# Patient Record
Sex: Female | Born: 1950 | Race: Black or African American | Hispanic: No | Marital: Married
Health system: Southern US, Community
[De-identification: ages and names within clinical notes are randomized; demographics above are authoritative.]

## PROBLEM LIST (undated history)

## (undated) DIAGNOSIS — E7800 Pure hypercholesterolemia, unspecified: Secondary | ICD-10-CM

## (undated) DIAGNOSIS — N289 Disorder of kidney and ureter, unspecified: Secondary | ICD-10-CM

## (undated) DIAGNOSIS — R7301 Impaired fasting glucose: Secondary | ICD-10-CM

## (undated) DIAGNOSIS — I10 Essential (primary) hypertension: Secondary | ICD-10-CM

## (undated) HISTORY — DX: Pure hypercholesterolemia, unspecified: E78.00

## (undated) HISTORY — DX: Impaired fasting glucose: R73.01

## (undated) HISTORY — DX: Disorder of kidney and ureter, unspecified: N28.9

## (undated) HISTORY — DX: Essential (primary) hypertension: I10

---

## 2017-10-23 DIAGNOSIS — Z9289 Personal history of other medical treatment: Secondary | ICD-10-CM

## 2017-10-23 HISTORY — DX: Personal history of other medical treatment: Z92.89

## 2020-12-12 ENCOUNTER — Emergency Department (HOSPITAL_COMMUNITY): Payer: Medicare PPO

## 2020-12-12 ENCOUNTER — Other Ambulatory Visit: Payer: Self-pay

## 2020-12-12 ENCOUNTER — Observation Stay (HOSPITAL_COMMUNITY)
Admission: EM | Admit: 2020-12-12 | Discharge: 2020-12-13 | Disposition: A | Discharge status: Home / Self Care | Payer: Medicare PPO | Attending: Emergency Medicine | Admitting: Emergency Medicine

## 2020-12-12 ENCOUNTER — Observation Stay (HOSPITAL_COMMUNITY): Payer: Medicare PPO

## 2020-12-12 DIAGNOSIS — M6281 Muscle weakness (generalized): Secondary | ICD-10-CM | POA: Diagnosis not present

## 2020-12-12 DIAGNOSIS — I10 Essential (primary) hypertension: Secondary | ICD-10-CM | POA: Diagnosis not present

## 2020-12-12 DIAGNOSIS — Z87891 Personal history of nicotine dependence: Secondary | ICD-10-CM | POA: Diagnosis not present

## 2020-12-12 DIAGNOSIS — R29898 Other symptoms and signs involving the musculoskeletal system: Secondary | ICD-10-CM | POA: Diagnosis not present

## 2020-12-12 DIAGNOSIS — R7303 Prediabetes: Secondary | ICD-10-CM | POA: Diagnosis not present

## 2020-12-12 DIAGNOSIS — Z20822 Contact with and (suspected) exposure to covid-19: Secondary | ICD-10-CM | POA: Diagnosis not present

## 2020-12-12 DIAGNOSIS — I639 Cerebral infarction, unspecified: Secondary | ICD-10-CM | POA: Diagnosis not present

## 2020-12-12 DIAGNOSIS — R4701 Aphasia: Secondary | ICD-10-CM | POA: Diagnosis present

## 2020-12-12 DIAGNOSIS — I6300 Cerebral infarction due to thrombosis of unspecified precerebral artery: Secondary | ICD-10-CM

## 2020-12-12 LAB — DIFFERENTIAL
Basophils Absolute: 0 10*3/uL (ref 0.0–0.1)
Basophils Relative: 0 %
Eosinophils Absolute: 0 10*3/uL (ref 0.0–0.5)
Eosinophils Relative: 0 %
Lymphocytes Relative: 14 %
Lymphs Abs: 1.1 10*3/uL (ref 0.7–4.0)
Monocytes Absolute: 0.5 10*3/uL (ref 0.1–1.0)
Monocytes Relative: 7 %
Neutro Abs: 5.8 10*3/uL (ref 1.7–7.7)
Neutrophils Relative %: 78 %

## 2020-12-12 LAB — COMPREHENSIVE METABOLIC PANEL
ALT: 19 U/L (ref 0–44)
AST: 21 U/L (ref 15–41)
Albumin: 3.6 g/dL (ref 3.5–5.0)
Alkaline Phosphatase: 76 U/L (ref 38–126)
Anion gap: 7 (ref 5–15)
BUN: 13 mg/dL (ref 8–23)
CO2: 24 mmol/L (ref 22–32)
Calcium: 9.5 mg/dL (ref 8.9–10.3)
Chloride: 106 mmol/L (ref 98–111)
Creatinine, Ser: 1.13 mg/dL — ABNORMAL HIGH (ref 0.44–1.00)
GFR, Estimated: 52 mL/min — ABNORMAL LOW (ref >60)
Glucose, Bld: 161 mg/dL — ABNORMAL HIGH (ref 70–99)
Potassium: 3.9 mmol/L (ref 3.5–5.1)
Sodium: 137 mmol/L (ref 135–145)
Total Bilirubin: 0.6 mg/dL (ref 0.3–1.2)
Total Protein: 7.6 g/dL (ref 6.5–8.1)

## 2020-12-12 LAB — CBC
HCT: 46.5 % — ABNORMAL HIGH (ref 36.0–46.0)
Hemoglobin: 15.2 g/dL — ABNORMAL HIGH (ref 12.0–15.0)
MCH: 31.3 pg (ref 26.0–34.0)
MCHC: 32.7 g/dL (ref 30.0–36.0)
MCV: 95.7 fL (ref 80.0–100.0)
Platelets: 361 10*3/uL (ref 150–400)
RBC: 4.86 MIL/uL (ref 3.87–5.11)
RDW: 11.5 % (ref 11.5–15.5)
WBC: 7.5 10*3/uL (ref 4.0–10.5)
nRBC: 0 % (ref 0.0–0.2)

## 2020-12-12 LAB — I-STAT CHEM 8, ED
BUN: 15 mg/dL (ref 8–23)
Calcium, Ion: 1.06 mmol/L — ABNORMAL LOW (ref 1.15–1.40)
Chloride: 105 mmol/L (ref 98–111)
Creatinine, Ser: 1.2 mg/dL — ABNORMAL HIGH (ref 0.44–1.00)
Glucose, Bld: 158 mg/dL — ABNORMAL HIGH (ref 70–99)
HCT: 46 % (ref 36.0–46.0)
Hemoglobin: 15.6 g/dL — ABNORMAL HIGH (ref 12.0–15.0)
Potassium: 3.9 mmol/L (ref 3.5–5.1)
Sodium: 140 mmol/L (ref 135–145)
TCO2: 24 mmol/L (ref 22–32)

## 2020-12-12 LAB — CBG MONITORING, ED: Glucose-Capillary: 145 mg/dL — ABNORMAL HIGH (ref 70–99)

## 2020-12-12 LAB — PROTIME-INR
INR: 1.1 (ref 0.8–1.2)
Prothrombin Time: 13.9 seconds (ref 11.4–15.2)

## 2020-12-12 LAB — APTT: aPTT: 27 seconds (ref 24–36)

## 2020-12-12 LAB — RESP PANEL BY RT-PCR (FLU A&B, COVID) ARPGX2
Influenza A by PCR: NEGATIVE
Influenza B by PCR: NEGATIVE
SARS Coronavirus 2 by RT PCR: NEGATIVE

## 2020-12-12 IMAGING — MR MR MRA NECK W/O CM
2 series · 18 of 48 positions shown · non-contrast
Comparison: None.

CLINICAL DATA: Neuro deficit, acute, stroke suspected; Stroke,
follow up; aphasia and facial droop



[Series 5: TOF · axial · 2.4mm · 0.47mm/px · z∈[-261,-91]mm · 15 of 152 slices shown (1 of 2)]
[im 1/152]
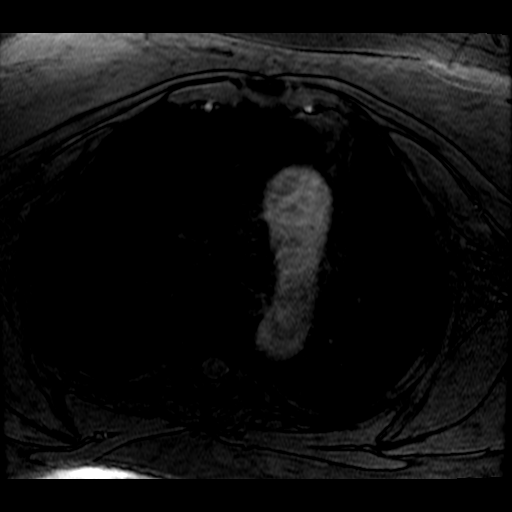
[im 4/152]
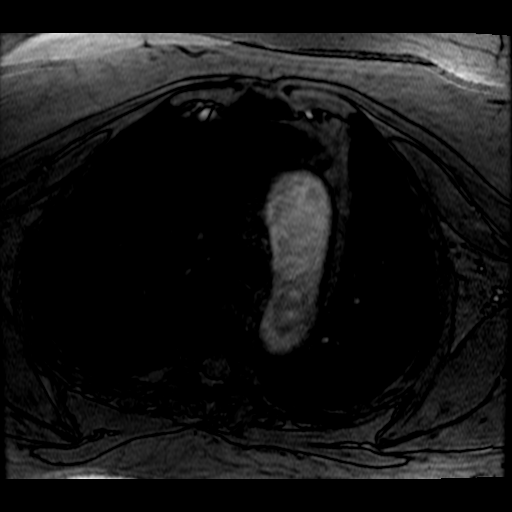
[im 7/152]
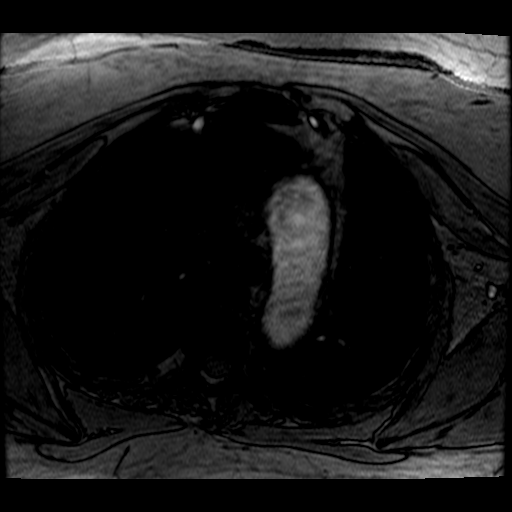
[im 11/152]
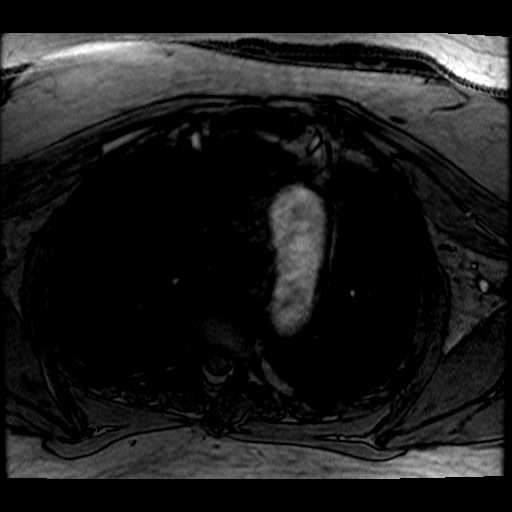
[im 14/152]
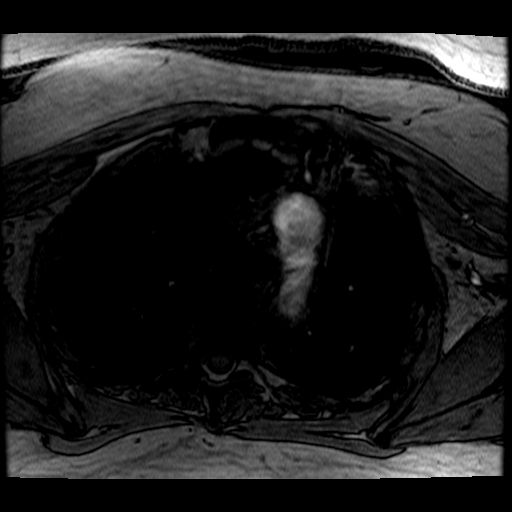
[im 25/152]
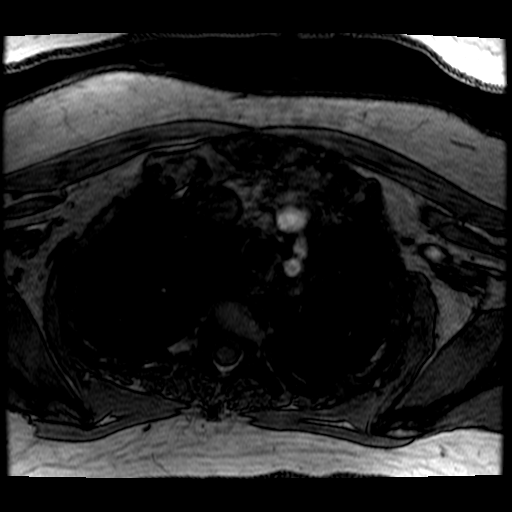
[im 28/152]
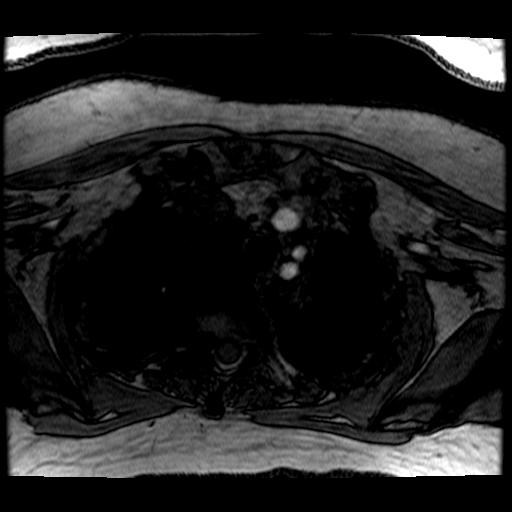
[im 49/152]
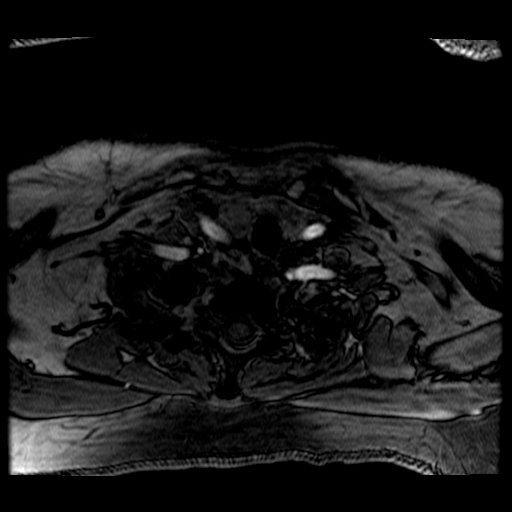
[im 66/152]
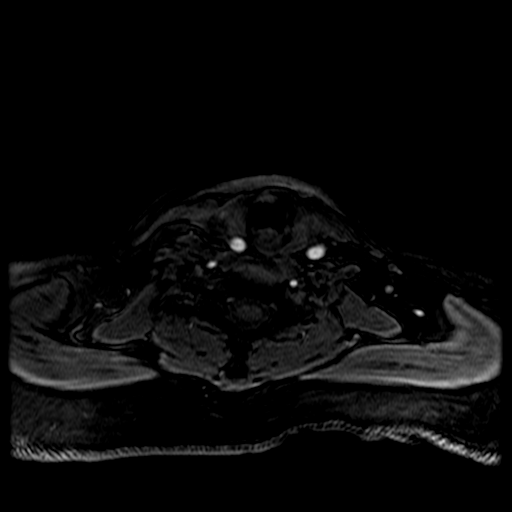
[im 76/152]
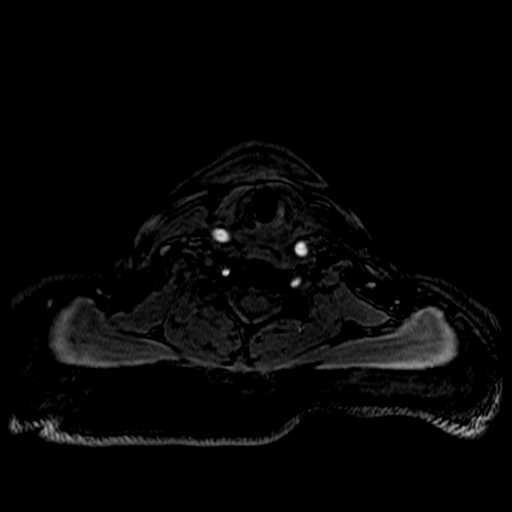
[im 86/152]
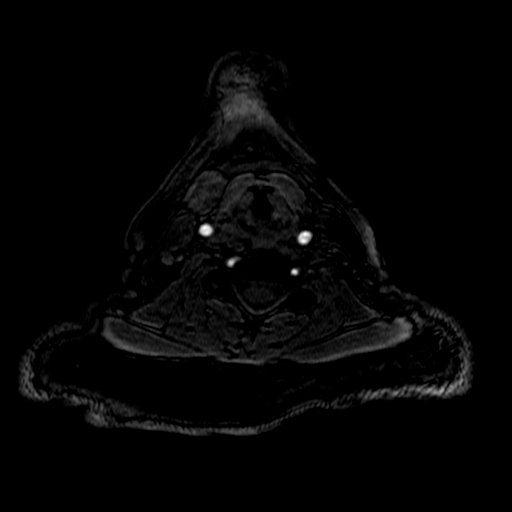
[im 103/152]
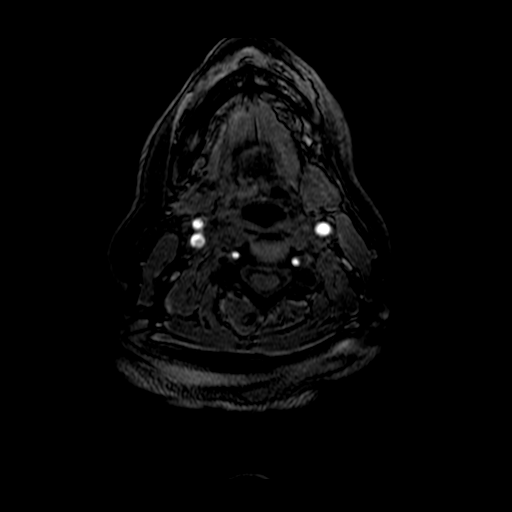
[im 124/152]
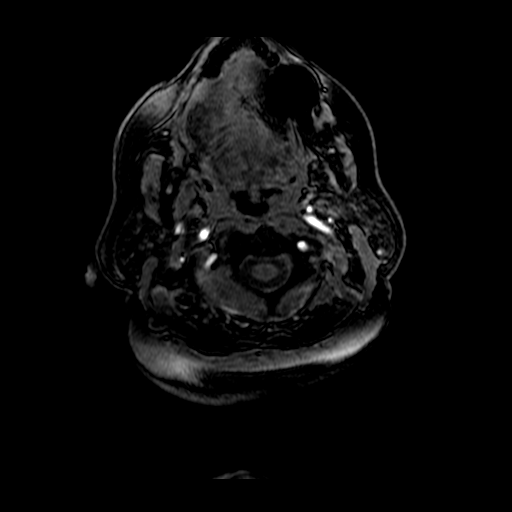
[im 127/152]
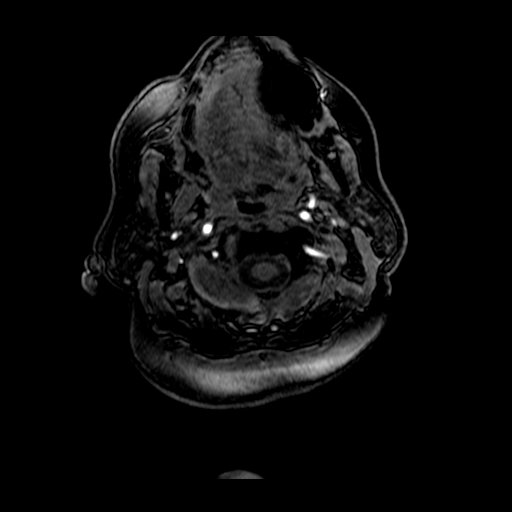
[im 145/152]
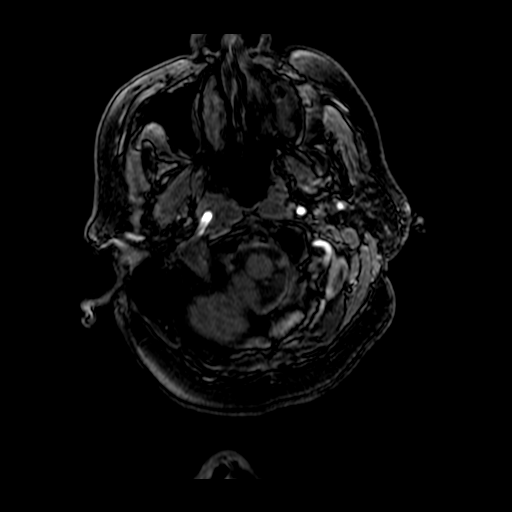

[Series 501: TOF · sagittal · 2.4mm · 0.47mm/px · 3 of 10 slices shown (2 of 2)]
[im 1/10]
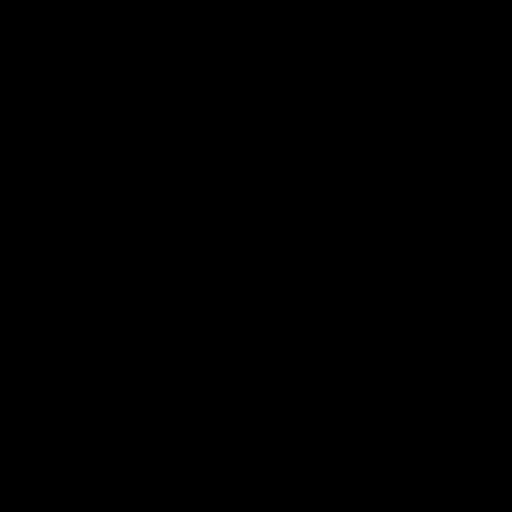
[im 5/10]
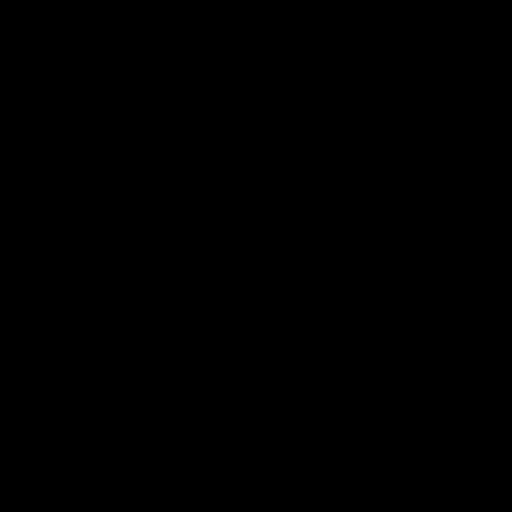
[im 10/10]
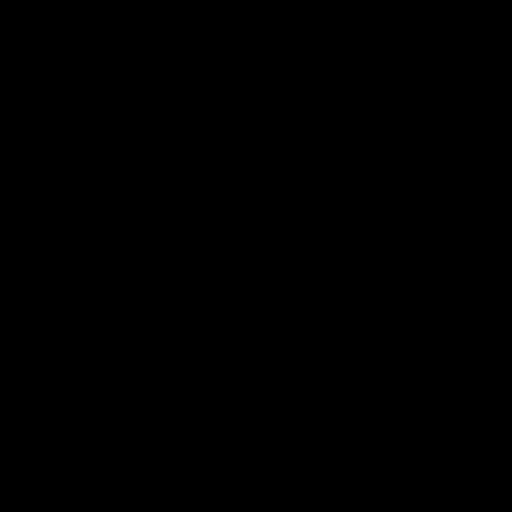

[18 of 48 positions shown; findings below may reference images not displayed]

FINDINGS: MRI HEAD

Brain: Small foci of reduced diffusion present along the lateral
left precentral gyrus. There is no evidence of intracranial
hemorrhage. There is no intracranial mass, mass effect, or edema.
There is no hydrocephalus or extra-axial fluid collection.
Ventricles and sulci are within normal limits in size and
configuration. Patchy foci of T2 hyperintensity in the
supratentorial white matter are nonspecific but may reflect mild
chronic microvascular ischemic changes.

Vascular: Major vessel flow voids at the skull base are preserved.

Skull and upper cervical spine: Normal marrow signal is preserved.

Sinuses/Orbits: Paranasal sinus mucosal thickening. Orbits are
unremarkable.

Other: Sella is unremarkable.  Mastoid air cells are clear.

MRA HEAD

Intracranial internal carotid arteries are patent. There is a 3 x 2
mm (measured axially) medially and inferiorly directed aneurysm of
the proximal supraclinoid right ICA. There is a 5 x 4 mm (measured
sagittally) superiorly directed aneurysm of the paraclinoid left
ICA. Middle and anterior cerebral arteries are patent.

Intracranial vertebral arteries, basilar artery, posterior cerebral
arteries are patent. Incidental small fenestration of the proximal
basilar artery. Bilateral posterior communicating arteries are
present.

MRA NECK

Great vessel origins are patent. Common, internal, and external
carotid arteries are patent. Extracranial vertebral arteries are
patent and codominant. There is no hemodynamically significant
stenosis.
IMPRESSION: Small acute infarcts of the left precentral gyrus.

No occlusion or significant stenosis in the neck. No proximal
intracranial vessel occlusion or significant stenosis.

3 mm proximal supraclinoid right ICA aneurysm. 5 mm left paraclinoid
ICA aneurysm.

## 2020-12-12 IMAGING — MR MR HEAD W/O CM
6 of 10 series · 29 of 48 positions shown · non-contrast
Comparison: None.

CLINICAL DATA: Neuro deficit, acute, stroke suspected; Stroke,
follow up; aphasia and facial droop



[Series 3: DWI · axial · 3.0mm · 0.94mm/px · z∈[-137,+2]mm · 9 of 97 slices shown (1 of 2)]
[im 1/97]
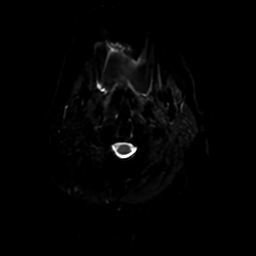
[im 13/97]
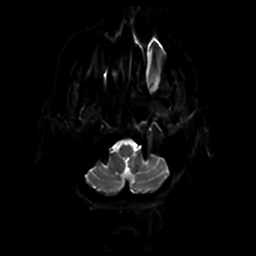
[im 25/97]
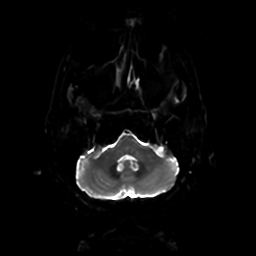
[im 37/97]
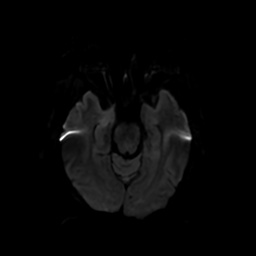
[im 49/97]
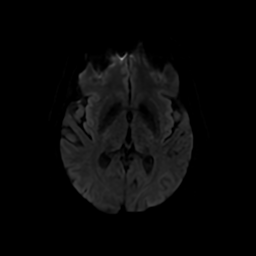
[im 61/97]
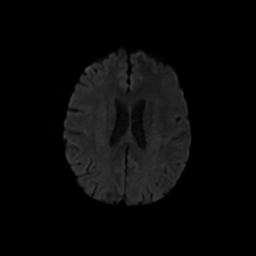
[im 73/97]
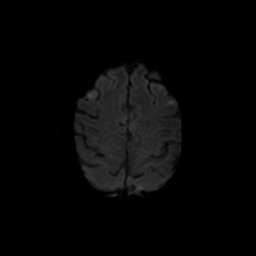
[im 85/97]
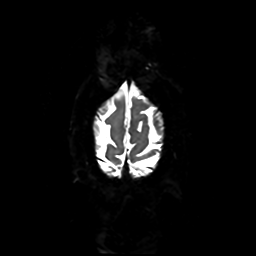
[im 97/97]
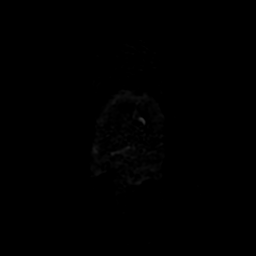

[Series 6: DWI · coronal · 4.0mm · 0.94mm/px · 7 of 70 slices shown (2 of 2)]
[im 1/70]
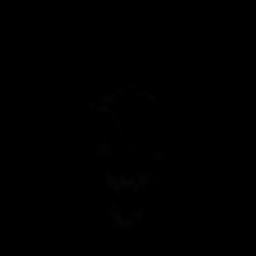
[im 12/70]
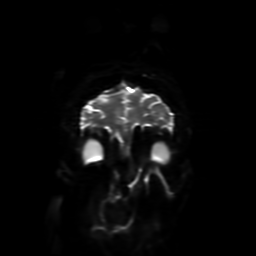
[im 24/70]
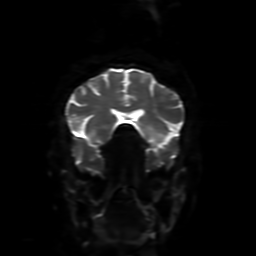
[im 35/70]
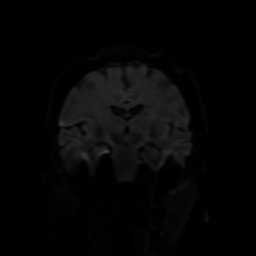
[im 47/70]
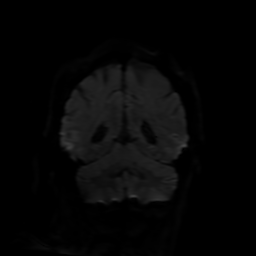
[im 58/70]
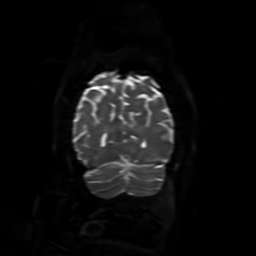
[im 70/70]
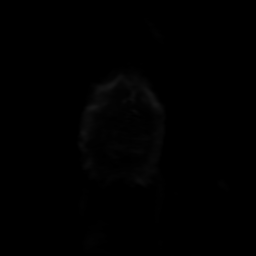

[Series 7: FLAIR · axial · 4.0mm · 0.45mm/px · z∈[-134,+3]mm · 3 of 34 slices shown (1 of 2)]
[im 1/34]
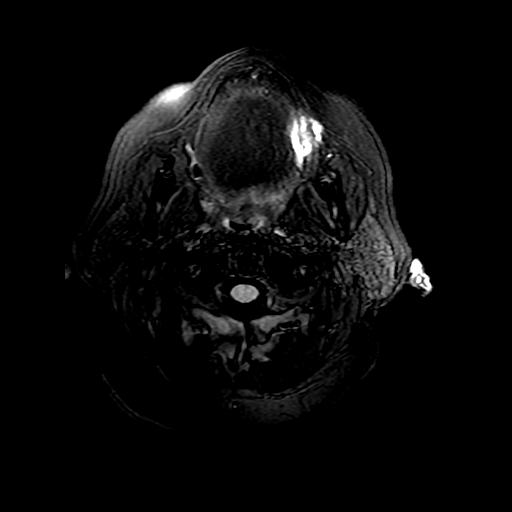
[im 17/34]
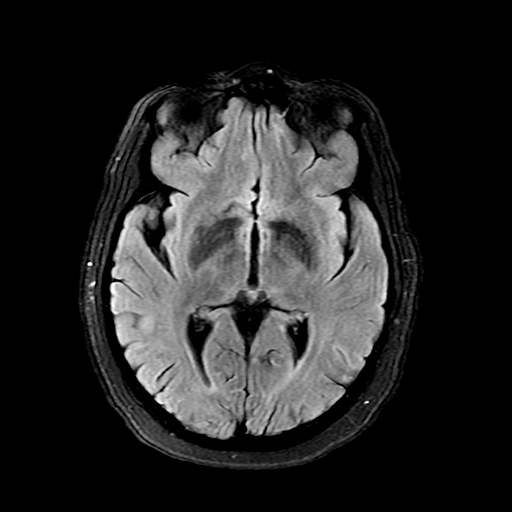
[im 34/34]
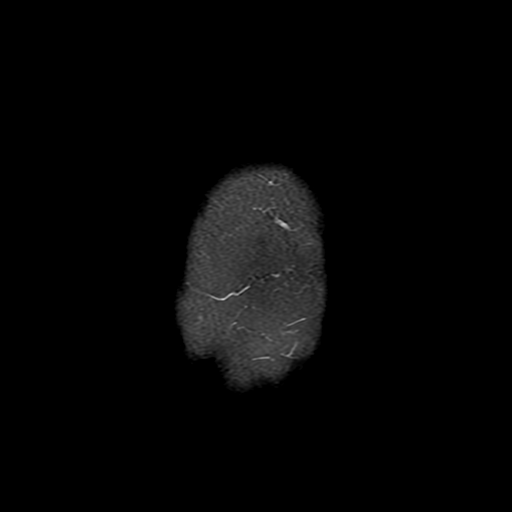

[Series 9: FLAIR · sagittal · 5.0mm · 0.23mm/px · 2 of 23 slices shown (2 of 2)]
[im 1/23]
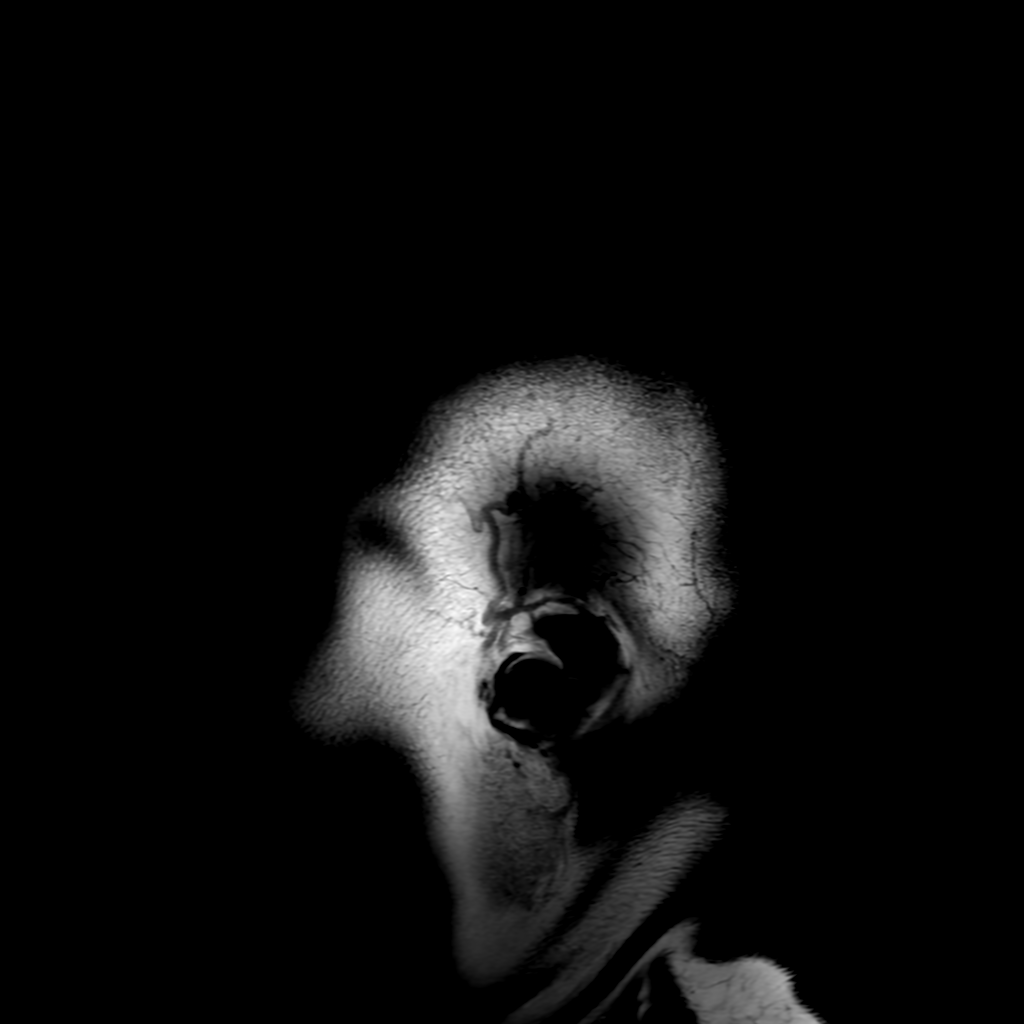
[im 23/23]
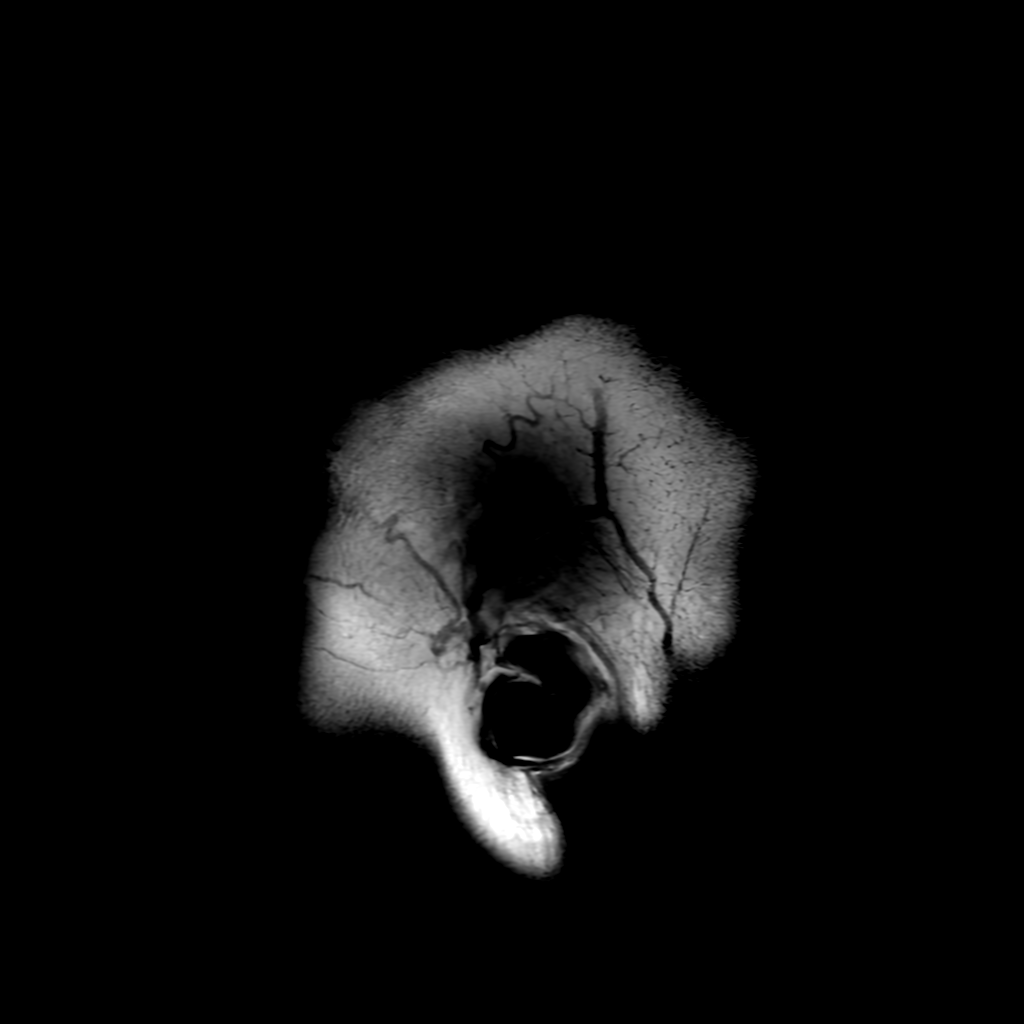

[Series 350: ADC · axial · 3.0mm · 0.94mm/px · z∈[-137,+2]mm · 5 of 50 slices shown (1 of 2)]
[im 1/50]
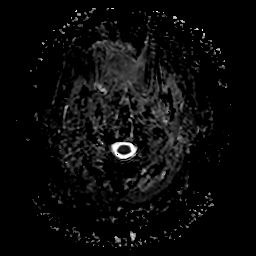
[im 13/50]
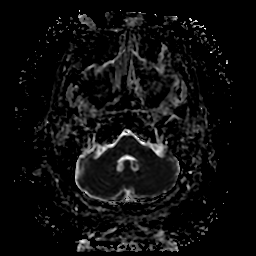
[im 25/50]
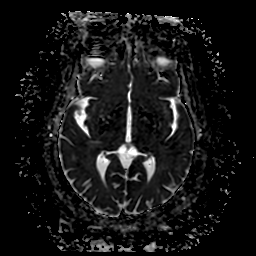
[im 37/50]
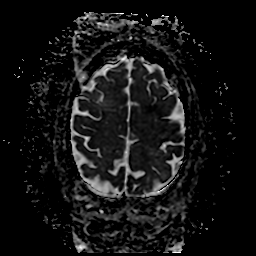
[im 50/50]
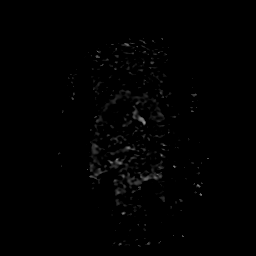

[Series 650: ADC · coronal · 4.0mm · 0.94mm/px · 3 of 35 slices shown (2 of 2)]
[im 1/35]
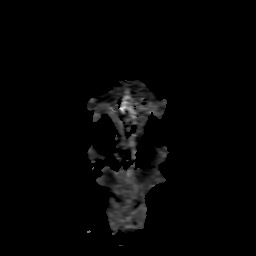
[im 18/35]
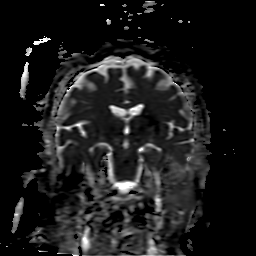
[im 35/35]
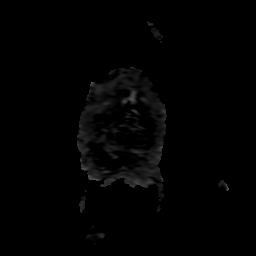

[29 of 48 positions shown; findings below may reference images not displayed]

FINDINGS: MRI HEAD

Brain: Small foci of reduced diffusion present along the lateral
left precentral gyrus. There is no evidence of intracranial
hemorrhage. There is no intracranial mass, mass effect, or edema.
There is no hydrocephalus or extra-axial fluid collection.
Ventricles and sulci are within normal limits in size and
configuration. Patchy foci of T2 hyperintensity in the
supratentorial white matter are nonspecific but may reflect mild
chronic microvascular ischemic changes.

Vascular: Major vessel flow voids at the skull base are preserved.

Skull and upper cervical spine: Normal marrow signal is preserved.

Sinuses/Orbits: Paranasal sinus mucosal thickening. Orbits are
unremarkable.

Other: Sella is unremarkable.  Mastoid air cells are clear.

MRA HEAD

Intracranial internal carotid arteries are patent. There is a 3 x 2
mm (measured axially) medially and inferiorly directed aneurysm of
the proximal supraclinoid right ICA. There is a 5 x 4 mm (measured
sagittally) superiorly directed aneurysm of the paraclinoid left
ICA. Middle and anterior cerebral arteries are patent.

Intracranial vertebral arteries, basilar artery, posterior cerebral
arteries are patent. Incidental small fenestration of the proximal
basilar artery. Bilateral posterior communicating arteries are
present.

MRA NECK

Great vessel origins are patent. Common, internal, and external
carotid arteries are patent. Extracranial vertebral arteries are
patent and codominant. There is no hemodynamically significant
stenosis.
IMPRESSION: Small acute infarcts of the left precentral gyrus.

No occlusion or significant stenosis in the neck. No proximal
intracranial vessel occlusion or significant stenosis.

3 mm proximal supraclinoid right ICA aneurysm. 5 mm left paraclinoid
ICA aneurysm.

## 2020-12-12 IMAGING — CT CT HEAD CODE STROKE
3 series · 16 of 47 positions shown, 19 images · non-contrast
Comparison: None.

CLINICAL DATA: Code stroke. Neuro deficit, acute, stroke suspected.
Right facial droop, slurred speech

EXAM:
CT HEAD WITHOUT CONTRAST
TECHNIQUE: Contiguous axial images were obtained from the base of the skull
through the vertex without intravenous contrast.

[Series 2: head 5.0 st · axial · 0.39mm/px · z∈[-69,+61]mm · 10 of 32 slices shown, 13 images]
[im 3/32  brain]
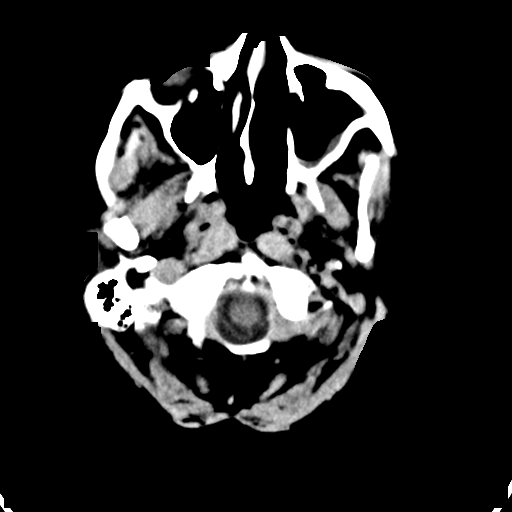
[im 3/32  bone]
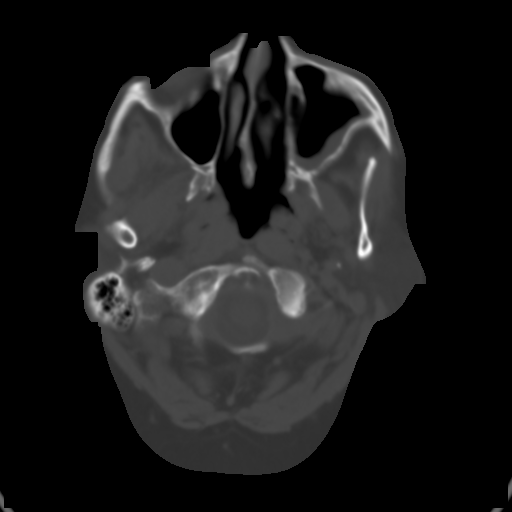
[im 6/32  brain]
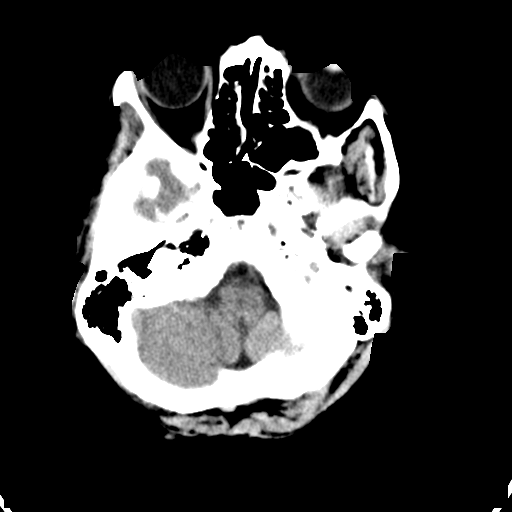
[im 9/32  brain]
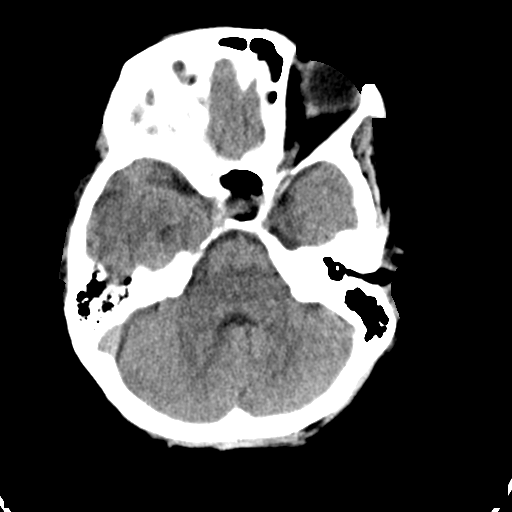
[im 11/32  brain]
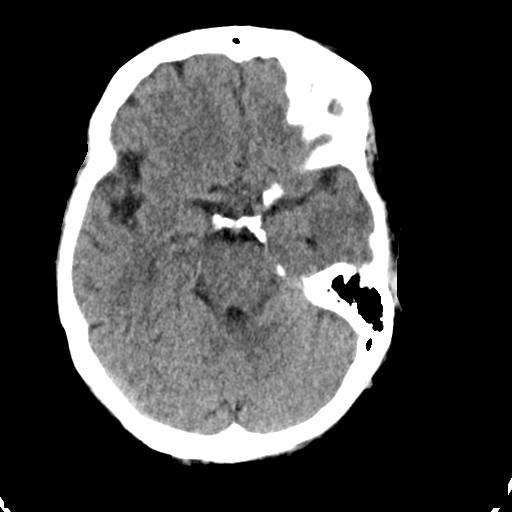
[im 14/32  brain]
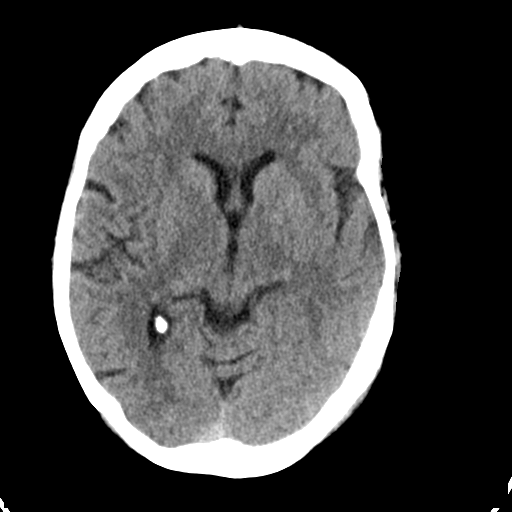
[im 14/32  bone]
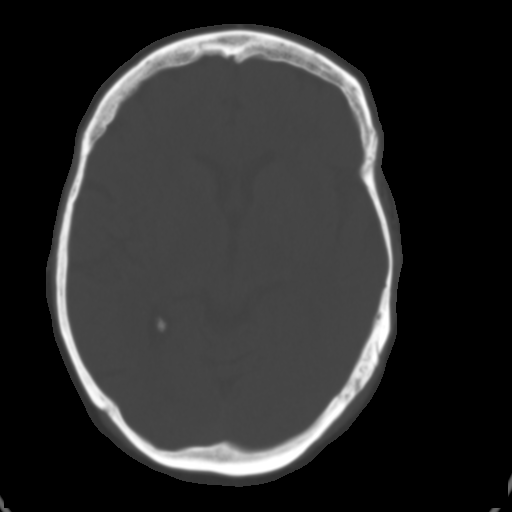
[im 18/32  brain]
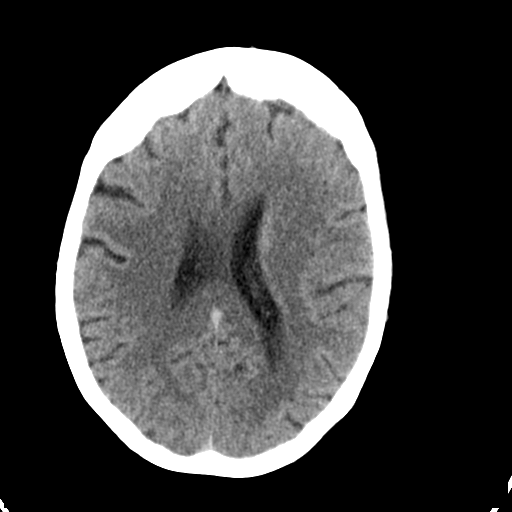
[im 21/32  brain]
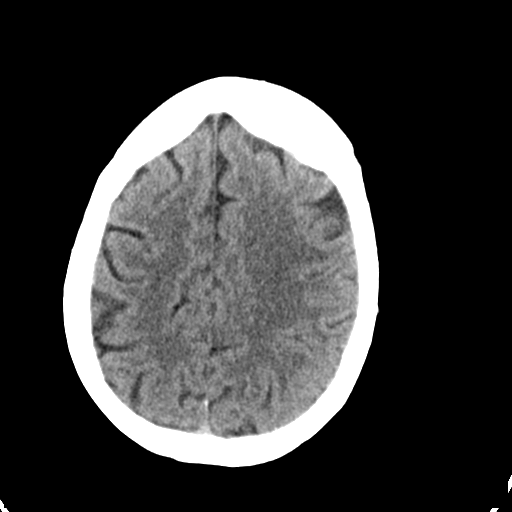
[im 24/32  brain]
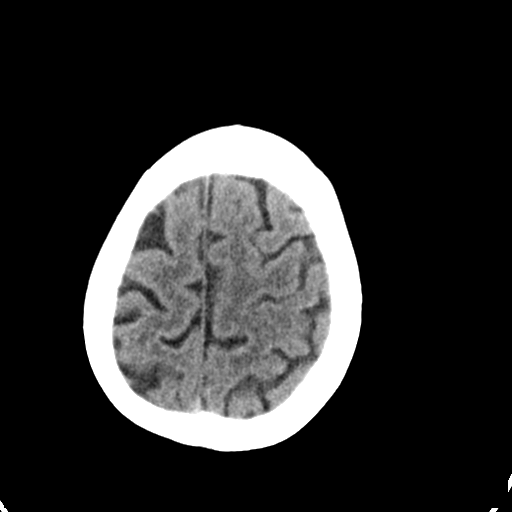
[im 26/32  brain]
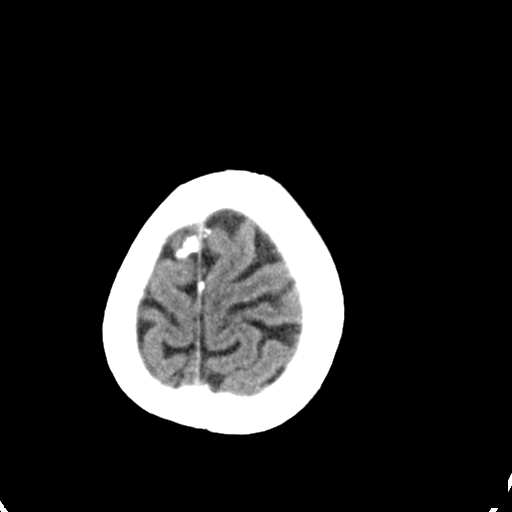
[im 26/32  bone]
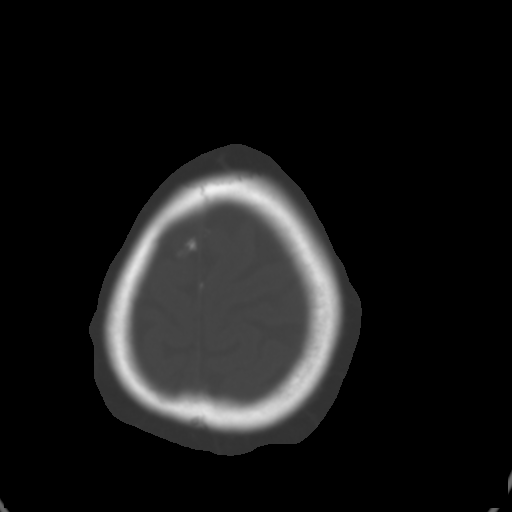
[im 29/32  brain]
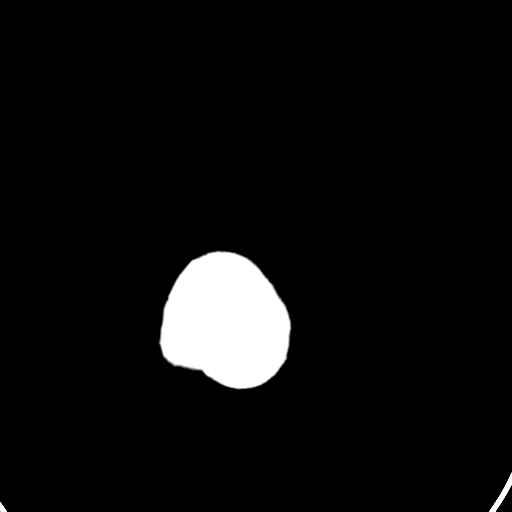

[Series 4: head 3.0 cor st · coronal · 0.30mm/px · 3 of 67 slices shown]
[im 23/67  brain]
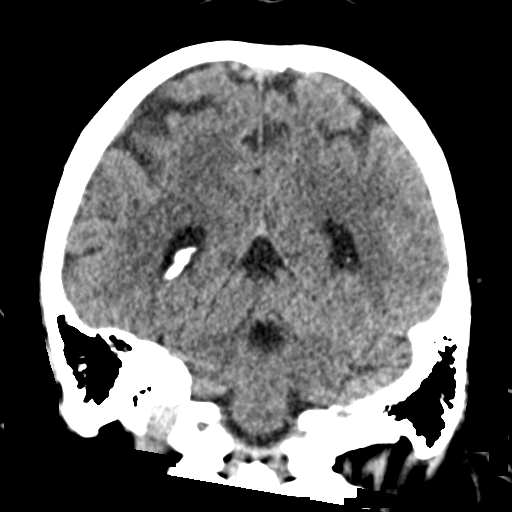
[im 30/67  brain]
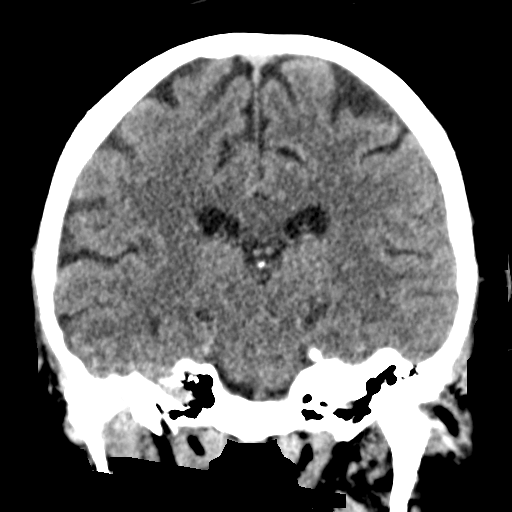
[im 37/67  brain]
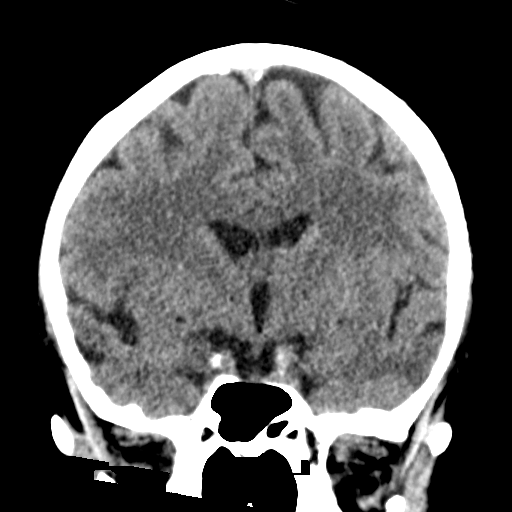

[Series 6: head 3.0 sag st · sagittal · 0.30mm/px · 3 of 66 slices shown]
[im 23/66  brain]
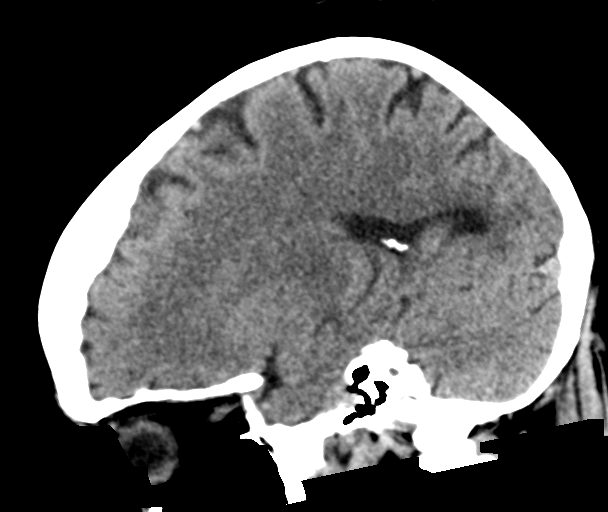
[im 33/66  brain]
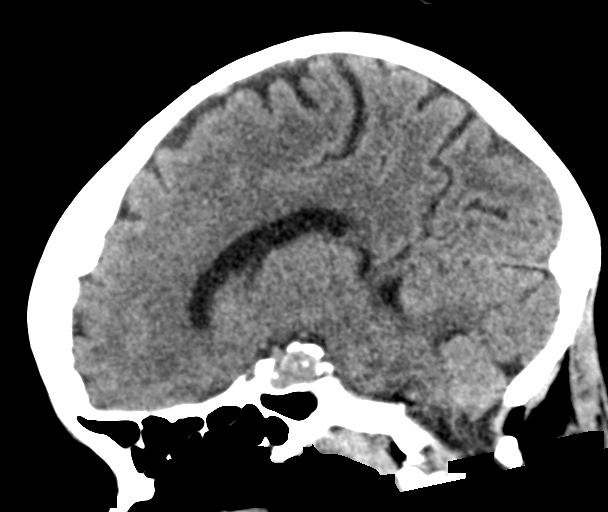
[im 44/66  brain]
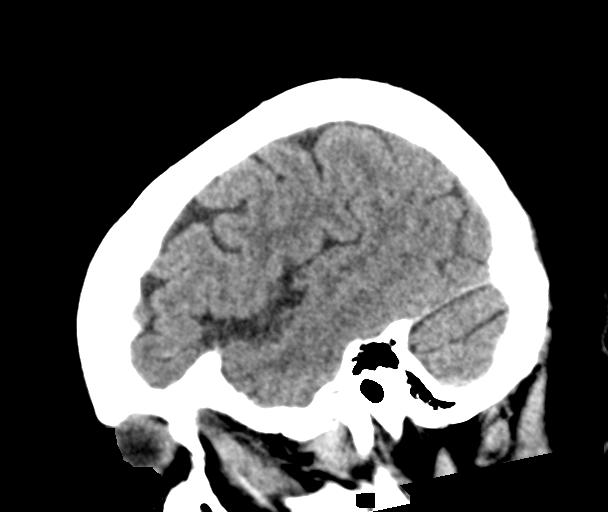

[16 of 47 positions shown; findings below may reference images not displayed]

FINDINGS: Brain: There is no acute intracranial hemorrhage, mass effect, or
edema. Gray-white differentiation is preserved. Possible
age-indeterminate small vessel infarct or prominent perivascular
space of the left thalamus.

Vascular: No hyperdense vessel. Mild intracranial atherosclerotic
calcification is present at the skull base.

Skull: Unremarkable.

Sinuses/Orbits: Patchy mucosal thickening. No significant orbital
abnormality.

Other: Mastoid air cells are clear.

ASPECTS (Alberta Stroke Program Early CT Score)

- Ganglionic level infarction (caudate, lentiform nuclei, internal
capsule, insula, M1-M3 cortex): 7

- Supraganglionic infarction (M4-M6 cortex): 3

Total score (0-10 with 10 being normal): 10
IMPRESSION: There is no acute intracranial hemorrhage or evidence of acute
infarction. ASPECT score is 10.

These results were communicated to Dr. WINGO at [DATE] on
[DATE] by text page via the [employer] messaging system.

## 2020-12-12 IMAGING — MR MR MRA HEAD W/O CM
2 series · 18 of 48 positions shown · non-contrast
Comparison: None.

CLINICAL DATA: Neuro deficit, acute, stroke suspected; Stroke,
follow up; aphasia and facial droop



[Series 2: ax (id) · axial · 1.0mm · 0.43mm/px · z∈[-115,-35]mm · 17 of 176 slices shown]
[im 1/176]
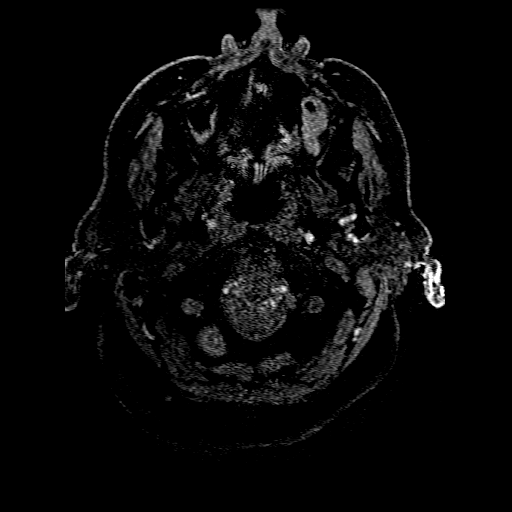
[im 4/176]
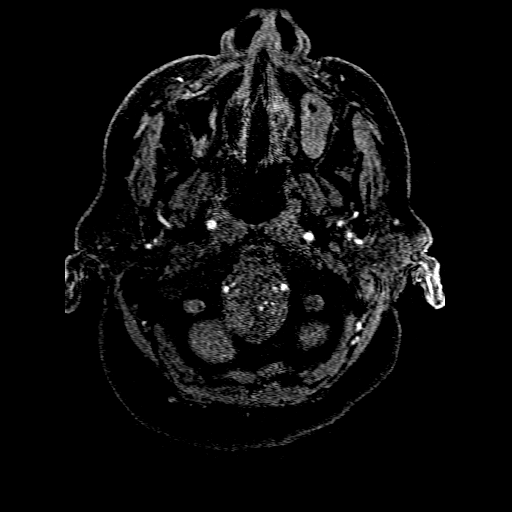
[im 8/176]
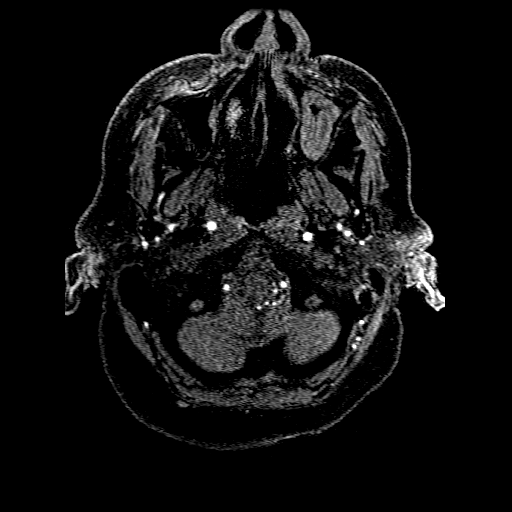
[im 12/176]
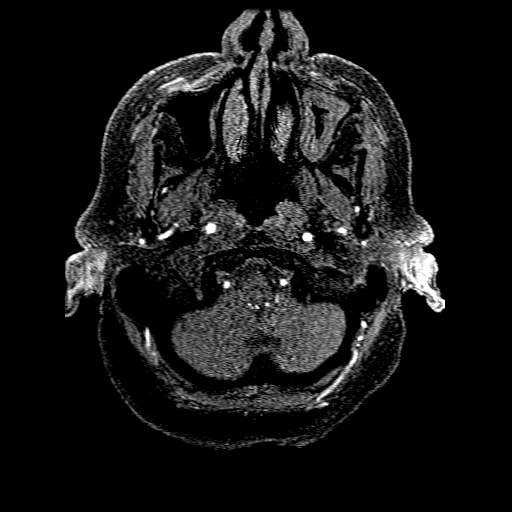
[im 16/176]
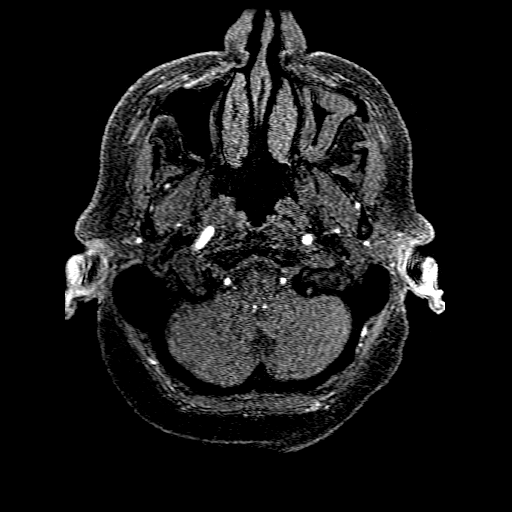
[im 20/176]
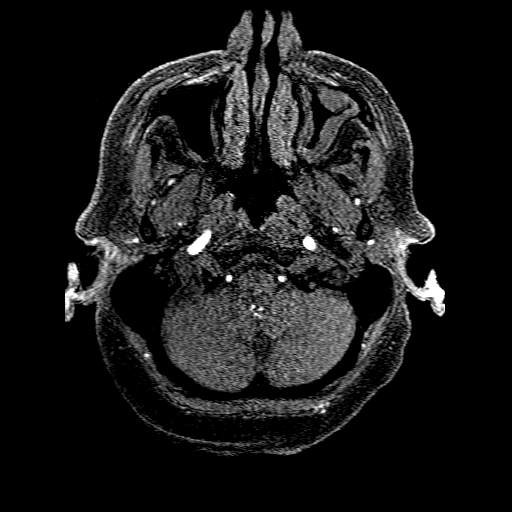
[im 23/176]
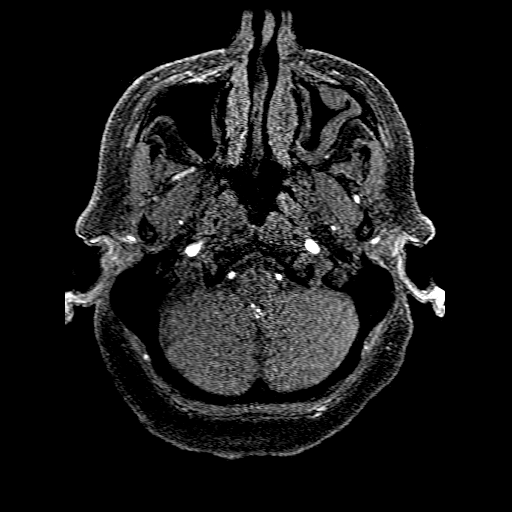
[im 27/176]
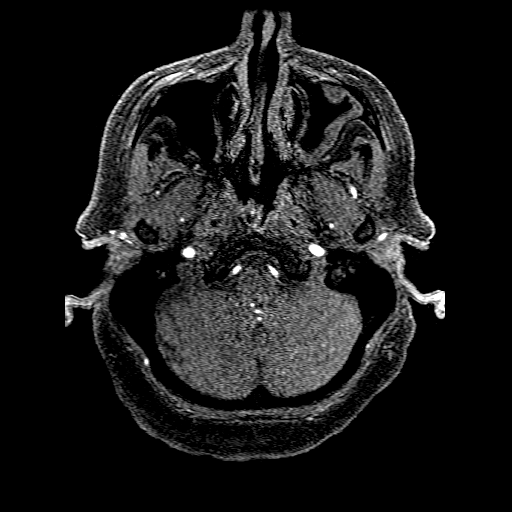
[im 31/176]
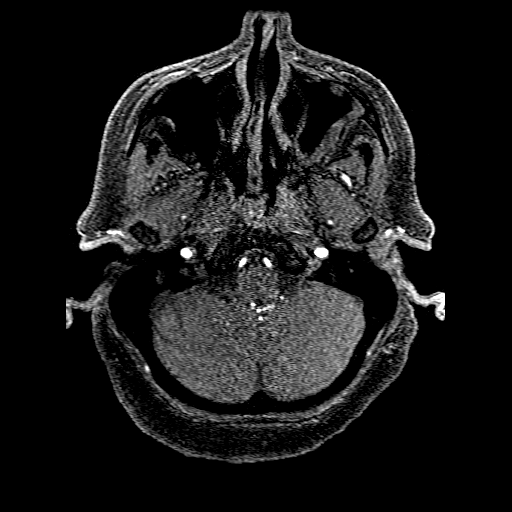
[im 54/176]
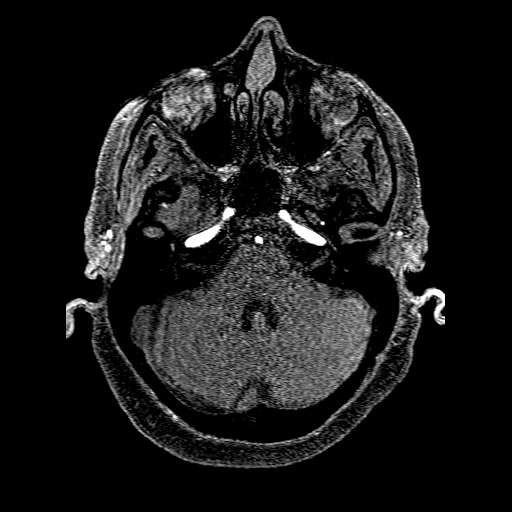
[im 77/176]
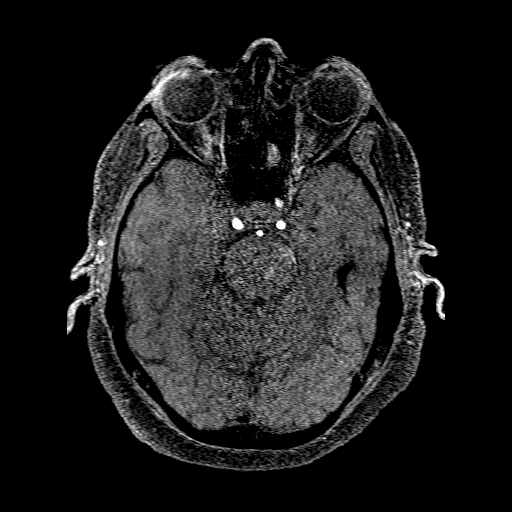
[im 88/176]
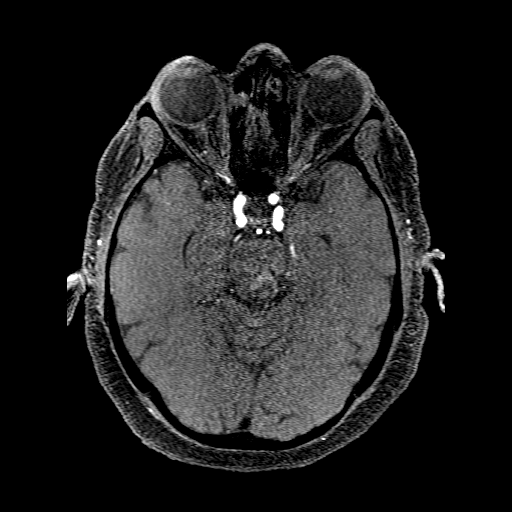
[im 99/176]
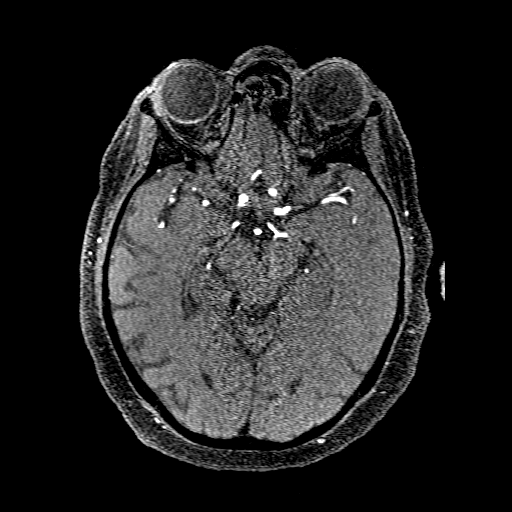
[im 122/176]
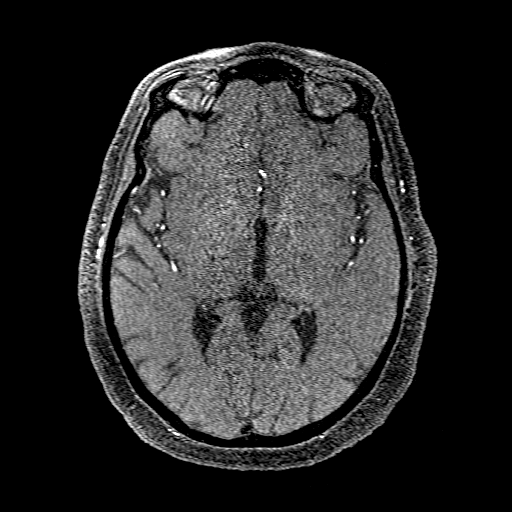
[im 145/176]
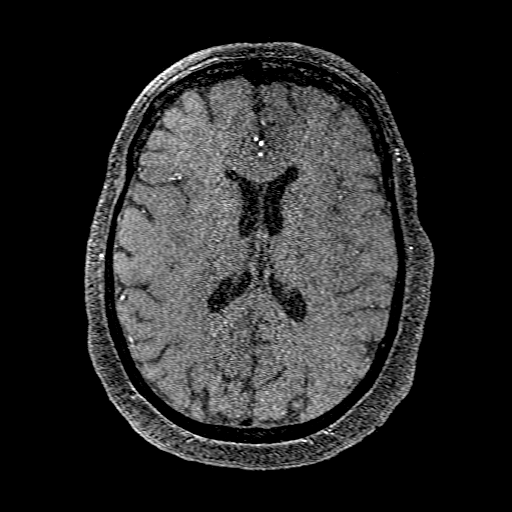
[im 149/176]
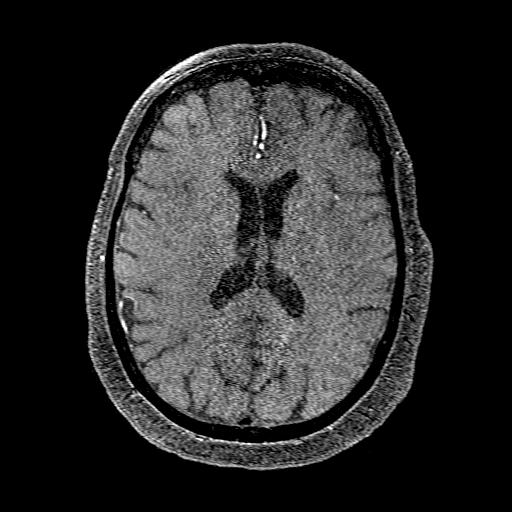
[im 168/176]
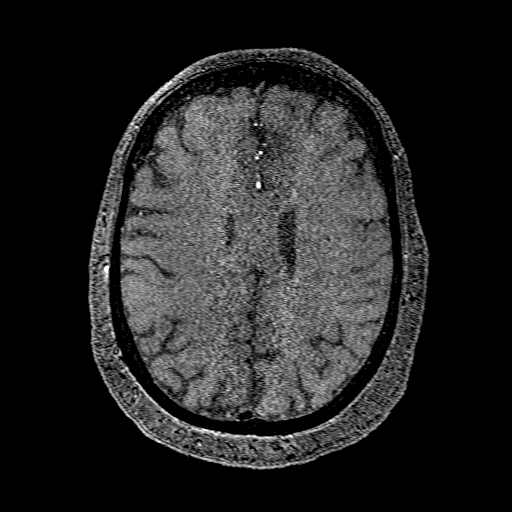

[Series 201: pjn:ax (id) · sagittal · 1.0mm · 0.43mm/px · 1 of 4 slices shown]
[im 1/4]
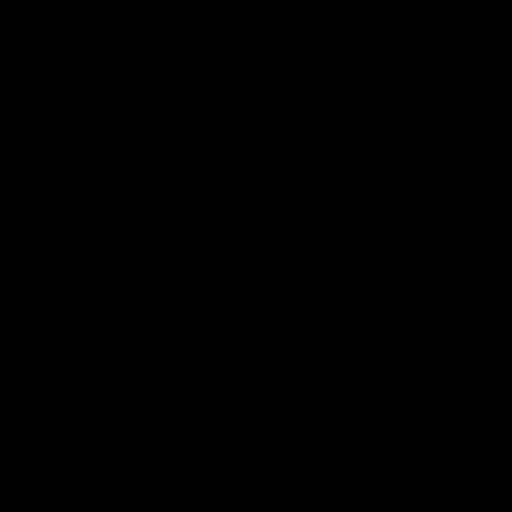

[18 of 48 positions shown; findings below may reference images not displayed]

FINDINGS: MRI HEAD

Brain: Small foci of reduced diffusion present along the lateral
left precentral gyrus. There is no evidence of intracranial
hemorrhage. There is no intracranial mass, mass effect, or edema.
There is no hydrocephalus or extra-axial fluid collection.
Ventricles and sulci are within normal limits in size and
configuration. Patchy foci of T2 hyperintensity in the
supratentorial white matter are nonspecific but may reflect mild
chronic microvascular ischemic changes.

Vascular: Major vessel flow voids at the skull base are preserved.

Skull and upper cervical spine: Normal marrow signal is preserved.

Sinuses/Orbits: Paranasal sinus mucosal thickening. Orbits are
unremarkable.

Other: Sella is unremarkable.  Mastoid air cells are clear.

MRA HEAD

Intracranial internal carotid arteries are patent. There is a 3 x 2
mm (measured axially) medially and inferiorly directed aneurysm of
the proximal supraclinoid right ICA. There is a 5 x 4 mm (measured
sagittally) superiorly directed aneurysm of the paraclinoid left
ICA. Middle and anterior cerebral arteries are patent.

Intracranial vertebral arteries, basilar artery, posterior cerebral
arteries are patent. Incidental small fenestration of the proximal
basilar artery. Bilateral posterior communicating arteries are
present.

MRA NECK

Great vessel origins are patent. Common, internal, and external
carotid arteries are patent. Extracranial vertebral arteries are
patent and codominant. There is no hemodynamically significant
stenosis.
IMPRESSION: Small acute infarcts of the left precentral gyrus.

No occlusion or significant stenosis in the neck. No proximal
intracranial vessel occlusion or significant stenosis.

3 mm proximal supraclinoid right ICA aneurysm. 5 mm left paraclinoid
ICA aneurysm.

## 2020-12-12 MED ORDER — STROKE: EARLY STAGES OF RECOVERY BOOK
Freq: Once | Status: AC
  Filled 2020-12-12 (×2): qty 1

## 2020-12-12 MED ORDER — SODIUM CHLORIDE 0.9% FLUSH
3.0000 mL | Freq: Once | INTRAVENOUS | Status: AC
Start: 2020-12-12 — End: 2020-12-12
  Administered 2020-12-12: 19:00:00 3 mL via INTRAVENOUS

## 2020-12-12 MED ORDER — HYDRALAZINE HCL 25 MG PO TABS: 25.0000 mg | ORAL_TABLET | Freq: Four times a day (QID) | ORAL | Status: DC | PRN

## 2020-12-12 MED ORDER — SODIUM CHLORIDE 0.9 % IV SOLN: INTRAVENOUS | Status: DC

## 2020-12-12 MED ORDER — ACETAMINOPHEN 650 MG RE SUPP: 650.0000 mg | RECTAL | Status: DC | PRN

## 2020-12-12 MED ORDER — LORAZEPAM 2 MG/ML IJ SOLN: 1.0000 mg | Freq: Once | INTRAMUSCULAR | Status: DC

## 2020-12-12 MED ORDER — ACETAMINOPHEN 160 MG/5ML PO SOLN: 650.0000 mg | ORAL | Status: DC | PRN

## 2020-12-12 MED ORDER — SENNOSIDES-DOCUSATE SODIUM 8.6-50 MG PO TABS: 1.0000 | ORAL_TABLET | Freq: Every evening | ORAL | Status: DC | PRN

## 2020-12-12 MED ORDER — ASPIRIN EC 81 MG PO TBEC
81.0000 mg | DELAYED_RELEASE_TABLET | Freq: Every day | ORAL | Status: DC
  Administered 2020-12-13: 81 mg via ORAL
  Filled 2020-12-12 (×2): qty 1

## 2020-12-12 MED ORDER — ASPIRIN 325 MG PO TABS
325.0000 mg | ORAL_TABLET | Freq: Once | ORAL | Status: AC
  Administered 2020-12-12: 19:00:00 325 mg via ORAL

## 2020-12-12 MED ORDER — ACETAMINOPHEN 325 MG PO TABS: 650.0000 mg | ORAL_TABLET | ORAL | Status: DC | PRN

## 2020-12-12 MED ORDER — ASPIRIN 300 MG RE SUPP: 300.0000 mg | Freq: Once | RECTAL | Status: DC

## 2020-12-12 MED ORDER — ENOXAPARIN SODIUM 40 MG/0.4ML IJ SOSY
40.0000 mg | PREFILLED_SYRINGE | INTRAMUSCULAR | Status: DC
  Administered 2020-12-13: 40 mg via SUBCUTANEOUS
  Filled 2020-12-12: qty 0.4

## 2020-12-12 NOTE — ED Notes (Signed)
Patient transported to MRI 

## 2020-12-12 NOTE — H&P (Signed)
History and Physical    Andrea Rollins MVH:[id number] DOB: Jan 26, 1950 DOA: 12/12/2020  PCP: Wilmon Pali, MD (Confirm with patient/family/NH records and if not entered, this has to be entered at [facility] point of entry) Patient coming from: Home  I have personally briefly reviewed patient's old medical records in [facility]  Chief Complaint: trouble to speak  HPI: Andrea Rollins is a 70 y.o. female with medical history significant of HTN, pre-diabetes, came with new onset of aphasia and facial droop.  Patient was noticed to have trouble speaking, which she described as "tongue out of control", but denied any trouble understanding others's talking or forming sentences in her mind, and right arm and hand "sluggish" and family also noticed significant facial droop around 10 AM this morning, not improving in 1 hour and family drove her to ED. No cough, choking, no headache or chest pain or palpitations.  Patient has pre-diabetes and most recent A1C 6.0 in September 2022 and she used to take baby ASA but stopped taking last year.  ED Course: Vital signs within normal limits.   CT head negative for acute findings.  Code stroke called and neurology considered patient not a candidate for TnKase for her relative mild neurological deficit.  Review of Systems: As per HPI otherwise 14 point review of systems negative.    No past medical history on file.    has no history on file for tobacco use, alcohol use, and drug use.  Not on File  No family history on file.   Prior to Admission medications   Not on File    Physical Exam: Vitals:   12/12/20 1400 12/12/20 1430 12/12/20 1445 12/12/20 1526  BP: 119/77 125/67 124/70 122/65  Pulse: 86  90 94  Resp: (!) 24   18  Temp:    98.5 F (36.9 C)  TempSrc:    Oral  SpO2: 98%  100% 99%    Constitutional: NAD, calm, comfortable Vitals:   12/12/20 1400 12/12/20 1430 12/12/20 1445 12/12/20 1526  BP: 119/77 125/67 124/70 122/65  Pulse:  86  90 94  Resp: (!) 24   18  Temp:    98.5 F (36.9 C)  TempSrc:    Oral  SpO2: 98%  100% 99%   Eyes: PERRL, lids and conjunctivae normal ENMT: Mucous membranes are moist. Posterior pharynx clear of any exudate or lesions.Normal dentition.  Neck: normal, supple, no masses, no thyromegaly Respiratory: clear to auscultation bilaterally, no wheezing, no crackles. Normal respiratory effort. No accessory muscle use.  Cardiovascular: Regular rate and rhythm, no murmurs / rubs / gallops. No extremity edema. 2+ pedal pulses. No carotid bruits.  Abdomen: no tenderness, no masses palpated. No hepatosplenomegaly. Bowel sounds positive.  Musculoskeletal: no clubbing / cyanosis. No joint deformity upper and lower extremities. Good ROM, no contractures. Normal muscle tone.  Skin: no rashes, lesions, ulcers. No induration Neurologic: Right side facial droop and right sided tongue deviation. Sensation intact, DTR normal. Strength 5/5 in all 4. Right hand coordination sluggish compared to left side Psychiatric: Normal judgment and insight. Alert and oriented x 3. Normal mood.     Labs on Admission: I have personally reviewed following labs and imaging studies  CBC: Recent Labs  Lab 12/12/20 1201 12/12/20 1210  WBC 7.5  --   NEUTROABS 5.8  --   HGB 15.2* 15.6*  HCT 46.5* 46.0  MCV 95.7  --   PLT 361  --    Basic Metabolic Panel: Recent Labs  Lab  12/12/20 1201 12/12/20 1210  NA 137 140  K 3.9 3.9  CL 106 105  CO2 24  --   GLUCOSE 161* 158*  BUN 13 15  CREATININE 1.13* 1.20*  CALCIUM 9.5  --    GFR: CrCl cannot be calculated (Unknown ideal weight.). Liver Function Tests: Recent Labs  Lab 12/12/20 1201  AST 21  ALT 19  ALKPHOS 76  BILITOT 0.6  PROT 7.6  ALBUMIN 3.6   No results for input(s): LIPASE, AMYLASE in the last 168 hours. No results for input(s): AMMONIA in the last 168 hours. Coagulation Profile: Recent Labs  Lab 12/12/20 1201  INR 1.1   Cardiac Enzymes: No  results for input(s): CKTOTAL, CKMB, CKMBINDEX, TROPONINI in the last 168 hours. BNP (last 3 results) No results for input(s): PROBNP in the last 8760 hours. HbA1C: No results for input(s): HGBA1C in the last 72 hours. CBG: Recent Labs  Lab 12/12/20 1205  GLUCAP 145*   Lipid Profile: No results for input(s): CHOL, HDL, LDLCALC, TRIG, CHOLHDL, LDLDIRECT in the last 72 hours. Thyroid Function Tests: No results for input(s): TSH, T4TOTAL, FREET4, T3FREE, THYROIDAB in the last 72 hours. Anemia Panel: No results for input(s): VITAMINB12, FOLATE, FERRITIN, TIBC, IRON, RETICCTPCT in the last 72 hours. Urine analysis: No results found for: COLORURINE, APPEARANCEUR, LABSPEC, PHURINE, GLUCOSEU, HGBUR, BILIRUBINUR, KETONESUR, PROTEINUR, UROBILINOGEN, NITRITE, LEUKOCYTESUR  Radiological Exams on Admission: CT HEAD CODE STROKE WO CONTRAST  Result Date: 12/12/2020 CLINICAL DATA:  Code stroke. Neuro deficit, acute, stroke suspected. Right facial droop, slurred speech EXAM: CT HEAD WITHOUT CONTRAST TECHNIQUE: Contiguous axial images were obtained from the base of the skull through the vertex without intravenous contrast. COMPARISON:  None. FINDINGS: Brain: There is no acute intracranial hemorrhage, mass effect, or edema. Gray-white differentiation is preserved. Possible age-indeterminate small vessel infarct or prominent perivascular space of the left thalamus. Vascular: No hyperdense vessel. Mild intracranial atherosclerotic calcification is present at the skull base. Skull: Unremarkable. Sinuses/Orbits: Patchy mucosal thickening. No significant orbital abnormality. Other: Mastoid air cells are clear. ASPECTS (Alberta Stroke Program Early CT Score) - Ganglionic level infarction (caudate, lentiform nuclei, internal capsule, insula, M1-M3 cortex): 7 - Supraganglionic infarction (M4-M6 cortex): 3 Total score (0-10 with 10 being normal): 10 IMPRESSION: There is no acute intracranial hemorrhage or evidence of  acute infarction. ASPECT score is 10. These results were communicated to Dr. Otelia Limes at 12:17 pm on 12/12/2020 by text page via the [facility] messaging system. Electronically Signed   By: Guadlupe Spanish M.D.   On: 12/12/2020 12:19    EKG: Independently reviewed. Normal sinus, no ST-T changes.  Assessment/Plan Principal Problem:   Stroke (cerebrum) (HCC)  (please populate well all problems here in Problem List. (For example, if patient is on BP meds at home and you resume or decide to hold them, it is a problem that needs to be her. Same for CAD, COPD, HLD and so on)   Acute dysarthria with right sided facial droop and right sided tongue deviation -Code Stroke called, neurology evaluated the patient in the ER, considered noncandidate for TnKase due to relatively mild symptoms, -MRI pending, add MRA -Echo -Swallow evaluation underway, 1 dose of aspirin suppository, and start aspirin 81 mg p.o. daily tomorrow. -Allow permissive hypertension -Risk factor control, check lipid panel, A1c.  Cough and viral syndrome -A1c and flu  HTN -Hold home BP meds of metoprolol and spironolactone -Allow permissive hypertension for 2 days  Prediabetes -A1c, may need start diabetes medications for risk factor control.  DVT prophylaxis: Lovenox Code Status: Full code Family Communication: Niece at bedside Disposition Plan: Expect less than 2 midnight hospital stay Consults called: Neurology Admission status: Tele obs   Emeline General MD [facility] Pager [phone] 12/12/2020, 4:07 PM

## 2020-12-12 NOTE — Consult Note (Signed)
NEURO HOSPITALIST CONSULT NOTE   Requestig physician: Dr. Jeraldine Loots  Reason for Consult: Acute onset of right facial droop, slurred speech and RUE numbness  History obtained from:   Patient and Chart     HPI:                                                                                                                                          Andrea Rollins is an 70 y.o. female with a PMHx of HTN, pre-diabetes, CKD 3A and HLD presenting to the ED with acute onset of right sided facial droop, slurred speech, and right arm numbness. LKN 10 AM.  No arm drift on initial Triage evaluation. Code Stroke was activated.  At symptom onset, she was noted by family to have trouble speaking, which she described as "tongue out of control". She did not have any comprehension deficit or, difficulty forming sentences in her mind. She felt that her right arm and hand were "sluggish" and family also noticed significant facial droop. After her symptoms did not improve after an hour, family drove her to ED.   She used to take ASA but stopped about one year ago. She has no prior history of stroke.    PMHx As above   No family history on file.          Social History:  has no history on file for tobacco use, alcohol use, and drug use.   HOME MEDICATIONS:                                                                                                                     No current facility-administered medications on file prior to encounter.   No current outpatient medications on file prior to encounter.    ROS:  No cough, choking, CP or headache. No palpitations. Other ROS as per HPI.   Blood pressure 135/80, pulse 94, temperature 98.3 F (36.8 C), temperature source Oral, resp. rate 17, SpO2 96 %.   General Examination:                                                                                                        Physical Exam  HEENT-  [place]/AT. Partially edentulous Lungs- Respirations unlabored Extremities- Warm and well perfused  Neurological Examination Mental Status: Awake and alert. Speech fluent with intact comprehension, naming and repetition. Oriented to the year, month, day and state but not the city. Mild dysarthria in the context of being partially edentulous Cranial Nerves: II: Temporal visual fields intact with no extinction to DSS. PERRL.  III,IV, VI: No ptosis. EOMI.  V: Temp sensation equal bilaterally VII: Right facial droop.  VIII: Hearing intact to voice IX,X: No hypophonia XI: Head is midline XII: Midline tongue extension Motor: Subtle 4+/5 strength to right triceps, otherwise 5/5 strength in RUE.  LUE 5/5 Positive orbiting fingers test on the right.  No pronator drift RLE 5/5 LLE 5/5 Sensory:Temp and light touch intact throughout, bilaterally Deep Tendon Reflexes: 2+ and symmetric throughout Plantars: Right: downgoing   Left: downgoing Cerebellar: No ataxia with FNF bilaterally Gait: Deferred   Lab Results: Basic Metabolic Panel: Recent Labs  Lab 12/12/20 1210  NA 140  K 3.9  CL 105  GLUCOSE 158*  BUN 15  CREATININE 1.20*    CBC: Recent Labs  Lab 12/12/20 1210  HGB 15.6*  HCT 46.0    Cardiac Enzymes: No results for input(s): CKTOTAL, CKMB, CKMBINDEX, TROPONINI in the last 168 hours.  Lipid Panel: No results for input(s): CHOL, TRIG, HDL, CHOLHDL, VLDL, LDLCALC in the last 168 hours.  Imaging: No results found.  Assessment: Acute onset of right facial droop, slurred speech and RUE numbness 1. Exam reveals findings referable to the left MCA territory. Most likely etiology is acute ischemic stroke.  2. CT head: There is no acute intracranial hemorrhage or evidence of acute infarction. ASPECT score is 10. 3. NIHSS 5. Overall findings are mild and in this context risks of TNK  are felt to significantly outweigh potential benefits. The patient expressed understanding and agreement with the above.  4. Exam findings not consistent with LVO.  5. Stroke risk factors: HTN, pre-diabetes, CKD and HLD   Recommendations: 1. HgbA1c, fasting lipid panel 2. MRI/MRA of the brain without contrast. MRA neck without contrast.  3. PT consult, OT consult, Speech consult 4. Echocardiogram 5. Statin 6. Start ASA 81 mg po qd after 325 mg administered today.  7. Risk factor modification 8. Telemetry monitoring 9. Frequent neuro checks 10. NPO until passes stroke swallow screen 11. Permissive HTN x 24 hours   Electronically signed: Dr. Kerney Elbe 12/12/2020, 12:15 PM

## 2020-12-12 NOTE — Code Documentation (Signed)
Andrea Rollins is a 70 yo female who presented to the ED on 12/12/20 via private vehicle. She presents with expressive aphasia, slurred speech, and right sided facial droop. She has a PMH of HTN, CKD 3A, DM, HLD. She states that she was feeling abnormal 2 hours and 10 minutes before arrival. She presents with a NIH of 5. Patient is deemed too good to treat with TnK. Document Q15 vital signs and neuro checks q30 until patient is out of treatment window.   Cherly Hensen, RN Rapid Response RN

## 2020-12-12 NOTE — ED Triage Notes (Signed)
Pt here POV with c/o of right side facial droop and slurred speech. LNW 1000 12/12/20

## 2020-12-12 NOTE — ED Provider Notes (Signed)
[facility] EMERGENCY DEPARTMENT Provider Note   CSN: [id number] Arrival date & time: 12/12/20  1153  An emergency department physician performed an initial assessment on this suspected stroke patient at 1205.  History Chief Complaint  Patient presents with   Code Stroke    Andrea Rollins is a 70 y.o. female.  HPI Patient presents with her niece who provides the history as the patient has active presents for expressive aphasia.  Patient was generally well has no history of stroke, takes no blood thinning medication.  About 2 hours 10 minutes prior to ED arrival she began to feel differently.  This is corroborated from other family members.  She developed right facial droop and difficulty with speech.  Seemingly no weakness in her extremities.  Since onset symptoms have been persistent.  Patient cannot describe it as she has expressive aphasia, level 5 caveat secondary to mental status change.    No past medical history on file.  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  Social former smoker, nondrinker  Home Medications Prior to Admission medications   Not on File    Allergies    Patient has no allergy information on record.  Review of Systems   Review of Systems  Unable to perform ROS: Acuity of condition   Physical Exam Updated Vital Signs BP 118/81   Pulse 85   Temp 98.5 F (36.9 C) (Oral)   Resp 16   SpO2 97%   Physical Exam Vitals and nursing note reviewed.  Constitutional:      General: She is not in acute distress.    Appearance: She is well-developed.  HENT:     Head: Normocephalic and atraumatic.  Eyes:     Conjunctiva/sclera: Conjunctivae normal.  Cardiovascular:     Rate and Rhythm: Normal rate and regular rhythm.  Pulmonary:     Effort: Pulmonary effort is normal. No respiratory  distress.     Breath sounds: Normal breath sounds. No stridor.  Abdominal:     General: There is no distension.  Skin:    General: Skin is warm and dry.  Neurological:     Mental Status: She is alert and oriented to person, place, and time.     Cranial Nerves: Cranial nerve deficit and dysarthria present.     Comments: Right facial droop, expressive aphasia, with obvious struggling to produce speech which is garbled.  Upper extremity strength symmetric, no pronator drift, patient is awake, alert, following commands.    ED Results / Procedures / Treatments   Labs (all labs ordered are listed, but only abnormal results are displayed) Labs Reviewed  CBC - Abnormal; Notable for the following components:      Result Value   Hemoglobin 15.2 (*)    HCT 46.5 (*)    All other components within normal limits  COMPREHENSIVE METABOLIC PANEL - Abnormal; Notable for the following components:   Glucose, Bld 161 (*)    Creatinine, Ser 1.13 (*)    GFR, Estimated 52 (*)    All other components within normal limits  I-STAT CHEM 8, ED - Abnormal; Notable for the following components:   Creatinine, Ser 1.20 (*)    Glucose, Bld 158 (*)    Calcium, Ion 1.06 (*)    Hemoglobin 15.6 (*)    All other components within normal limits  CBG MONITORING, ED - Abnormal; Notable for the following components:   Glucose-Capillary 145 (*)    All other components within normal limits  RESP PANEL BY RT-PCR (FLU A&B, COVID) ARPGX2  PROTIME-INR  APTT  DIFFERENTIAL  HEMOGLOBIN A1C  LIPID PANEL  HIV ANTIBODY (ROUTINE TESTING W REFLEX)    EKG EKG Interpretation  Date/Time:  Sunday December 12 2020 12:45:52 EST Ventricular Rate:  90 PR Interval:  158 QRS Duration: 85 QT Interval:  361 QTC Calculation: 442 R Axis:   25 Text Interpretation: Sinus rhythm Baseline wander Artifact Abnormal ECG Confirmed by Gerhard Munch [phone]) on 12/12/2020 1:01:55 PM  Radiology MR ANGIO HEAD WO CONTRAST  Result Date:  12/12/2020 CLINICAL DATA:  Neuro deficit, acute, stroke suspected; Stroke, follow up; aphasia and facial droop EXAM: MRI HEAD WITHOUT CONTRAST MRA HEAD WITHOUT CONTRAST MRA NECK WITHOUT CONTRAST TECHNIQUE: Multiplanar, multiecho pulse sequences of the brain and surrounding structures were obtained without intravenous contrast. Angiographic images of the Circle of Willis were obtained using MRA technique without intravenous contrast. Angiographic images of the neck were obtained using MRA technique without intravenous contrast. Carotid stenosis measurements (when applicable) are obtained utilizing NASCET criteria, using the distal internal carotid diameter as the denominator. COMPARISON:  None. FINDINGS: MRI HEAD Brain: Small foci of reduced diffusion present along the lateral left precentral gyrus. There is no evidence of intracranial hemorrhage. There is no intracranial mass, mass effect, or edema. There is no hydrocephalus or extra-axial fluid collection. Ventricles and sulci are within normal limits in size and configuration. Patchy foci of T2 hyperintensity in the supratentorial white matter are nonspecific but may reflect mild chronic microvascular ischemic changes. Vascular: Major vessel flow voids at the skull base are preserved. Skull and upper cervical spine: Normal marrow signal is preserved. Sinuses/Orbits: Paranasal sinus mucosal thickening. Orbits are unremarkable. Other: Sella is unremarkable.  Mastoid air cells are clear. MRA HEAD Intracranial internal carotid arteries are patent. 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 There is a 5 x 4 mm (measured sagittally) superiorly directed aneurysm of the paraclinoid left ICA. Middle and anterior cerebral arteries are patent. Intracranial vertebral arteries, basilar artery, posterior cerebral arteries are patent. Incidental small fenestration of the proximal basilar artery.  Bilateral posterior communicating arteries are present. MRA NECK Great vessel origins are patent. Common, internal, and external carotid arteries are patent. Extracranial vertebral arteries are patent and codominant. There is no hemodynamically significant stenosis. 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 No occlusion or significant stenosis in the neck. No proximal intracranial vessel occlusion or significant stenosis. 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 Electronically Signed   By: Guadlupe Spanish M.D.   On: 12/12/2020 17:22   MR ANGIO NECK WO CONTRAST  Result Date: 12/12/2020 CLINICAL DATA:  Neuro deficit, acute, stroke suspected; Stroke, follow up; aphasia and facial droop EXAM: MRI HEAD WITHOUT CONTRAST MRA HEAD WITHOUT CONTRAST MRA NECK WITHOUT CONTRAST TECHNIQUE: Multiplanar, multiecho pulse sequences of the brain and surrounding structures were obtained without intravenous contrast. Angiographic images of the Circle of Willis were obtained using MRA technique without intravenous contrast. Angiographic images of the neck were obtained using MRA technique without intravenous contrast. Carotid stenosis measurements (when applicable) are obtained utilizing NASCET criteria, using the distal internal carotid diameter as the denominator. COMPARISON:  None. FINDINGS: MRI HEAD Brain: Small foci of reduced diffusion present along the lateral left  precentral gyrus. There is no evidence of intracranial hemorrhage. There is no intracranial mass, mass effect, or edema. There is no hydrocephalus or extra-axial fluid collection. Ventricles and sulci are within normal limits in size and configuration. Patchy foci of T2 hyperintensity in the supratentorial white matter are nonspecific but may reflect mild chronic microvascular ischemic changes. Vascular: Major vessel flow voids at the skull base are preserved. Skull and upper cervical spine: Normal marrow  signal is preserved. Sinuses/Orbits: Paranasal sinus mucosal thickening. Orbits are unremarkable. Other: Sella is unremarkable.  Mastoid air cells are clear. MRA HEAD Intracranial internal carotid arteries are patent. 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 There is a 5 x 4 mm (measured sagittally) superiorly directed aneurysm of the paraclinoid left ICA. Middle and anterior cerebral arteries are patent. Intracranial vertebral arteries, basilar artery, posterior cerebral arteries are patent. Incidental small fenestration of the proximal basilar artery. Bilateral posterior communicating arteries are present. MRA NECK Great vessel origins are patent. Common, internal, and external carotid arteries are patent. Extracranial vertebral arteries are patent and codominant. There is no hemodynamically significant stenosis. 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 No occlusion or significant stenosis in the neck. No proximal intracranial vessel occlusion or significant stenosis. 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 Electronically Signed   By: Guadlupe Spanish M.D.   On: 12/12/2020 17:22   MR BRAIN WO CONTRAST  Result Date: 12/12/2020 CLINICAL DATA:  Neuro deficit, acute, stroke suspected; Stroke, follow up; aphasia and facial droop EXAM: MRI HEAD WITHOUT CONTRAST MRA HEAD WITHOUT CONTRAST MRA NECK WITHOUT CONTRAST TECHNIQUE: Multiplanar, multiecho pulse sequences of the brain and surrounding structures were obtained without intravenous contrast. Angiographic images of the Circle of Willis were obtained using MRA technique without intravenous contrast. Angiographic images of the neck were obtained using MRA technique without intravenous contrast. Carotid stenosis measurements (when applicable) are obtained utilizing NASCET criteria, using the distal internal carotid diameter as  the denominator. COMPARISON:  None. FINDINGS: MRI HEAD Brain: Small foci of reduced diffusion present along the lateral left precentral gyrus. There is no evidence of intracranial hemorrhage. There is no intracranial mass, mass effect, or edema. There is no hydrocephalus or extra-axial fluid collection. Ventricles and sulci are within normal limits in size and configuration. Patchy foci of T2 hyperintensity in the supratentorial white matter are nonspecific but may reflect mild chronic microvascular ischemic changes. Vascular: Major vessel flow voids at the skull base are preserved. Skull and upper cervical spine: Normal marrow signal is preserved. Sinuses/Orbits: Paranasal sinus mucosal thickening. Orbits are unremarkable. Other: Sella is unremarkable.  Mastoid air cells are clear. MRA HEAD Intracranial internal carotid arteries are patent. 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 There is a 5 x 4 mm (measured sagittally) superiorly directed aneurysm of the paraclinoid left ICA. Middle and anterior cerebral arteries are patent. Intracranial vertebral arteries, basilar artery, posterior cerebral arteries are patent. Incidental small fenestration of the proximal basilar artery. Bilateral posterior communicating arteries are present. MRA NECK Great vessel origins are patent. Common, internal, and external carotid arteries are patent. Extracranial vertebral arteries are patent and codominant. There is no hemodynamically significant stenosis. 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 No occlusion or significant stenosis in the neck. No proximal intracranial vessel occlusion or significant stenosis. 3 mm proximal  supraclinoid right ICA aneurysm. 5 mm left paraclinoid ICA aneurysm. Electronically Signed   By: Guadlupe Spanish M.D.   On: 12/12/2020 17:22   CT HEAD CODE STROKE WO CONTRAST  Result Date: 12/12/2020 CLINICAL  DATA:  Code stroke. Neuro deficit, acute, stroke suspected. Right facial droop, slurred speech EXAM: CT HEAD WITHOUT CONTRAST TECHNIQUE: Contiguous axial images were obtained from the base of the skull through the vertex without intravenous contrast. COMPARISON:  None. FINDINGS: Brain: There is no acute intracranial hemorrhage, mass effect, or edema. Gray-white differentiation is preserved. Possible age-indeterminate small vessel infarct or prominent perivascular space of the left thalamus. Vascular: No hyperdense vessel. Mild intracranial atherosclerotic calcification is present at the skull base. Skull: Unremarkable. Sinuses/Orbits: Patchy mucosal thickening. No significant orbital abnormality. Other: Mastoid air cells are clear. ASPECTS ([place] Stroke Program Early CT Score) - Ganglionic level infarction (caudate, lentiform nuclei, internal capsule, insula, M1-M3 cortex): 7 - Supraganglionic infarction (M4-M6 cortex): 3 Total score (0-10 with 10 being normal): 10 IMPRESSION: There is no acute intracranial hemorrhage or evidence of acute infarction. ASPECT score is 10. These results were communicated to Dr. Otelia Limes at 12:17 pm on 12/12/2020 by text page via the [facility] messaging system. Electronically Signed   By: Guadlupe Spanish M.D.   On: 12/12/2020 12:19    Procedures Procedures   Medications Ordered in ED Medications  LORazepam (ATIVAN) injection 1 mg (1 mg Intravenous Patient Refused/Not Given 12/12/20 1842)  0.9 %  sodium chloride infusion ( Intravenous New Bag/Given 12/12/20 1841)  acetaminophen (TYLENOL) tablet 650 mg (has no administration in time range)    Or  acetaminophen (TYLENOL) 160 MG/5ML solution 650 mg (has no administration in time range)    Or  acetaminophen (TYLENOL) suppository 650 mg (has no administration in time range)  senna-docusate (Senokot-S) tablet 1 tablet (has no administration in time range)  enoxaparin (LOVENOX) injection 40 mg (40 mg Subcutaneous Not Given 12/12/20 1842)   hydrALAZINE (APRESOLINE) tablet 25 mg (has no administration in time range)  aspirin EC tablet 81 mg (has no administration in time range)  sodium chloride flush (NS) 0.9 % injection 3 mL (3 mLs Intravenous Given 12/12/20 1842)   stroke: mapping our early stages of recovery book ( Does not apply Given 12/12/20 1841)  aspirin tablet 325 mg (325 mg Oral Given 12/12/20 1855)    ED Course  I have reviewed the triage vital signs and the nursing notes.  Pertinent labs & imaging results that were available during my care of the patient were reviewed by me and considered in my medical decision making (see chart for details). I saw the patient on arrival into the main emergency department, cleared airway, accompanied her and family member to the CT scan and returned to bedside.  With concern for code stroke this was activated prior to my evaluation, is appropriate.  Neurology aware.  On repeat evaluation after patient had stat CT, no evidence for hemorrhage.  She continues to have some speech difficulty, but has been seen, evaluated by our neurology colleagues, low NIH, risk versus benefit analysis discussed.  Patient not a candidate for TNKase, per neurology (I discussed this recommendation at 8:25 PM ) However, with concern for clinical evidence for mild stroke MRI is pending, patient will require admission for further monitoring, management. MDM Rules/Calculators/A&P MDM Number of Diagnoses or Management Options Acute stroke due to ischemia [facility]): new, needed workup   Amount and/or Complexity of Data Reviewed Clinical lab tests: ordered and reviewed Tests in the  radiology section of CPT: ordered and reviewed Tests in the medicine section of CPT: reviewed and ordered Discussion of test results with the performing providers: yes Decide to obtain previous medical records or to obtain history from someone other than the patient: yes Obtain history from someone other than the patient: yes Review and  summarize past medical records: yes Discuss the patient with other providers: yes Independent visualization of images, tracings, or specimens: yes  Risk of Complications, Morbidity, and/or Mortality Presenting problems: high Diagnostic procedures: high Management options: high  Critical Care Total time providing critical care: < 30 minutes  Patient Progress Patient progress: stable   Final Clinical Impression(s) / ED Diagnoses Final diagnoses:  Acute stroke due to ischemia [facility])     Gerhard Munch, MD 12/12/20 2027

## 2020-12-12 NOTE — ED Notes (Signed)
R sided facial droop, slurred speech, and R arm numbness.  LKW 10am.  No arm drift.  Code Stroke Activated.

## 2020-12-12 NOTE — ED Notes (Signed)
[facility] called to activate code stroke, spoke with Taryn.

## 2020-12-13 ENCOUNTER — Observation Stay (HOSPITAL_BASED_OUTPATIENT_CLINIC_OR_DEPARTMENT_OTHER): Payer: Medicare PPO

## 2020-12-13 DIAGNOSIS — I6389 Other cerebral infarction: Secondary | ICD-10-CM

## 2020-12-13 DIAGNOSIS — I639 Cerebral infarction, unspecified: Secondary | ICD-10-CM | POA: Diagnosis not present

## 2020-12-13 DIAGNOSIS — I6300 Cerebral infarction due to thrombosis of unspecified precerebral artery: Secondary | ICD-10-CM | POA: Diagnosis not present

## 2020-12-13 LAB — HEMOGLOBIN A1C
Hgb A1c MFr Bld: 6 % — ABNORMAL HIGH (ref 4.8–5.6)
Mean Plasma Glucose: 125.5 mg/dL

## 2020-12-13 LAB — HIV ANTIBODY (ROUTINE TESTING W REFLEX)
HIV Screen 4th Generation wRfx: NONREACTIVE
HIV Screen 4th Generation wRfx: NONREACTIVE

## 2020-12-13 LAB — ECHOCARDIOGRAM COMPLETE
Area-P 1/2: 6.17 cm2
S' Lateral: 2.2 cm

## 2020-12-13 LAB — LIPID PANEL
Cholesterol: 161 mg/dL (ref 0–200)
HDL: 42 mg/dL (ref >40)
LDL Cholesterol: 105 mg/dL — ABNORMAL HIGH (ref 0–99)
Total CHOL/HDL Ratio: 3.8 RATIO
Triglycerides: 71 mg/dL (ref <150)
VLDL: 14 mg/dL (ref 0–40)

## 2020-12-13 MED ORDER — ATORVASTATIN CALCIUM 40 MG PO TABS: 40.0000 mg | ORAL_TABLET | Freq: Every day | ORAL | 2 refills | Status: AC

## 2020-12-13 MED ORDER — CLOPIDOGREL BISULFATE 75 MG PO TABS
75.0000 mg | ORAL_TABLET | Freq: Every day | ORAL | 0 refills | Status: AC
Start: 2020-12-14 — End: 2021-01-04

## 2020-12-13 MED ORDER — ATORVASTATIN CALCIUM 40 MG PO TABS
40.0000 mg | ORAL_TABLET | Freq: Every day | ORAL | Status: DC
  Administered 2020-12-13: 40 mg via ORAL
  Filled 2020-12-13: qty 1

## 2020-12-13 MED ORDER — CLOPIDOGREL BISULFATE 75 MG PO TABS
75.0000 mg | ORAL_TABLET | Freq: Every day | ORAL | Status: DC
  Administered 2020-12-13: 75 mg via ORAL
  Filled 2020-12-13: qty 1

## 2020-12-13 MED ORDER — PERFLUTREN LIPID MICROSPHERE
1.0000 mL | INTRAVENOUS | Status: AC | PRN
Start: 2020-12-13 — End: 2020-12-13
  Administered 2020-12-13: 2 mL via INTRAVENOUS
  Filled 2020-12-13: qty 10

## 2020-12-13 MED ORDER — ASPIRIN 81 MG PO TBEC: 81.0000 mg | DELAYED_RELEASE_TABLET | Freq: Every day | ORAL | 11 refills | Status: AC

## 2020-12-13 NOTE — Discharge Summary (Signed)
[facility]  Physician Discharge Summary   Patient ID: Andrea Rollins MRN: [id number] DOB/AGE: July 11, 1950 70 y.o.  Admit date: 12/12/2020 Discharge date: 12/13/2020    PCP: Wilmon Pali, MD  DISCHARGE DIAGNOSES:  Acute stroke Essential hypertension   RECOMMENDATIONS FOR OUTPATIENT FOLLOW UP: Cardiology to arrange outpatient loop recorder   Home Health: None Equipment/Devices: None  CODE STATUS: Full code  DISCHARGE CONDITION: fair  Diet recommendation: Heart healthy  INITIAL HISTORY: Andrea Rollins is a 70 y.o. female with medical history significant of HTN, pre-diabetes, came with new onset of aphasia and facial droop.  MRI showed acute stroke.  Patient was hospitalized for further management.  Consultants: Neurology   Procedures: Transthoracic echocardiogram    HOSPITAL COURSE:   Acute stroke Initially a code stroke was called but she was not a candidate for TNKase due to mild symptoms.  Underwent MRA which did not show any significant stenosis in the head or neck vasculature. LDL is 105.  HbA1c is 6.0. Echocardiogram shows normal systolic function with grade 1 diastolic dysfunction. Started on statin.  Neurology recommends aspirin and Plavix for 3 weeks followed by aspirin alone. Seen by PT OT and speech therapy as well.  No needs identified. Loop recorder recommended by neurology.  Cardiology will arrange in the outpatient setting.  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 Outpatient surveillance by neurology  Essential hypertension Resume home medications   Cough Respiratory status is stable.  Saturating normal on room air.  Influenza and COVID PCR negative.    Patient is stable.  Loop recorder to be arranged as an outpatient per cardiology.  Discussed with patient and her sister and niece.  Okay for discharge today.   PERTINENT LABS:  The results of significant diagnostics from  this hospitalization (including imaging, microbiology, ancillary and laboratory) are listed below for reference.    Microbiology: Recent Results (from the past 240 hour(s))  Resp Panel by RT-PCR (Flu A&B, Covid) Nasopharyngeal Swab     Status: None   Collection Time: 12/12/20  4:16 PM   Specimen: Nasopharyngeal Swab; Nasopharyngeal(NP) swabs in vial transport medium  Result Value Ref Range Status   SARS Coronavirus 2 by RT PCR NEGATIVE NEGATIVE Final    Comment: (NOTE) SARS-CoV-2 target nucleic acids are NOT DETECTED.  The SARS-CoV-2 RNA is generally detectable in upper respiratory specimens during the acute phase of infection. The lowest concentration of SARS-CoV-2 viral copies this assay can detect is 138 copies/mL. A negative result does not preclude SARS-Cov-2 infection and should not be used as the sole basis for treatment or other patient management decisions. A negative result may occur with  improper specimen collection/handling, submission of specimen other than nasopharyngeal swab, presence of viral mutation(s) within the areas targeted by this assay, and inadequate number of viral copies(<138 copies/mL). A negative result must be combined with clinical observations, patient history, and epidemiological information. The expected result is Negative.  Fact Sheet for Patients:  BloggerCourse.com  Fact Sheet for Healthcare Providers:  SeriousBroker.it  This test is no t yet approved or cleared by the [place] FDA and  has been authorized for detection and/or diagnosis of SARS-CoV-2 by FDA under an Emergency Use Authorization (EUA). This EUA will remain  in effect (meaning this test can be used) for the duration of the COVID-19 declaration under Section 564(b)(1) of the Act, 21 U.S.C.section 360bbb-3(b)(1), unless the authorization is terminated  or revoked sooner.  Influenza A by PCR NEGATIVE NEGATIVE Final    Influenza B by PCR NEGATIVE NEGATIVE Final    Comment: (NOTE) The Xpert Xpress SARS-CoV-2/FLU/RSV plus assay is intended as an aid in the diagnosis of influenza from Nasopharyngeal swab specimens and should not be used as a sole basis for treatment. Nasal washings and aspirates are unacceptable for Xpert Xpress SARS-CoV-2/FLU/RSV testing.  Fact Sheet for Patients: BloggerCourse.com  Fact Sheet for Healthcare Providers: SeriousBroker.it  This test is not yet approved or cleared by the [place] FDA and has been authorized for detection and/or diagnosis of SARS-CoV-2 by FDA under an Emergency Use Authorization (EUA). This EUA will remain in effect (meaning this test can be used) for the duration of the COVID-19 declaration under Section 564(b)(1) of the Act, 21 U.S.C. section 360bbb-3(b)(1), unless the authorization is terminated or revoked.  Performed at [facility], [address], [place], [place] [address]      Labs:  COVID-19 Labs   Lab Results  Component Value Date   SARSCOV2NAA NEGATIVE 12/12/2020      Basic Metabolic Panel: Recent Labs  Lab 12/12/20 1201 12/12/20 1210  NA 137 140  K 3.9 3.9  CL 106 105  CO2 24  --   GLUCOSE 161* 158*  BUN 13 15  CREATININE 1.13* 1.20*  CALCIUM 9.5  --    Liver Function Tests: Recent Labs  Lab 12/12/20 1201  AST 21  ALT 19  ALKPHOS 76  BILITOT 0.6  PROT 7.6  ALBUMIN 3.6    CBC: Recent Labs  Lab 12/12/20 1201 12/12/20 1210  WBC 7.5  --   NEUTROABS 5.8  --   HGB 15.2* 15.6*  HCT 46.5* 46.0  MCV 95.7  --   PLT 361  --      CBG: Recent Labs  Lab 12/12/20 1205  GLUCAP 145*     IMAGING STUDIES MR ANGIO HEAD WO CONTRAST  Result Date: 12/12/2020 CLINICAL DATA:  Neuro deficit, acute, stroke suspected; Stroke, follow up; aphasia and facial droop EXAM: MRI HEAD WITHOUT CONTRAST MRA HEAD WITHOUT CONTRAST MRA NECK WITHOUT CONTRAST TECHNIQUE:  Multiplanar, multiecho pulse sequences of the brain and surrounding structures were obtained without intravenous contrast. Angiographic images of the Circle of Willis were obtained using MRA technique without intravenous contrast. Angiographic images of the neck were obtained using MRA technique without intravenous contrast. Carotid stenosis measurements (when applicable) are obtained utilizing NASCET criteria, using the distal internal carotid diameter as the denominator. COMPARISON:  None. FINDINGS: MRI HEAD Brain: Small foci of reduced diffusion present along the lateral left precentral gyrus. There is no evidence of intracranial hemorrhage. There is no intracranial mass, mass effect, or edema. There is no hydrocephalus or extra-axial fluid collection. Ventricles and sulci are within normal limits in size and configuration. Patchy foci of T2 hyperintensity in the supratentorial white matter are nonspecific but may reflect mild chronic microvascular ischemic changes. Vascular: Major vessel flow voids at the skull base are preserved. Skull and upper cervical spine: Normal marrow signal is preserved. Sinuses/Orbits: Paranasal sinus mucosal thickening. Orbits are unremarkable. Other: Sella is unremarkable.  Mastoid air cells are clear. MRA HEAD Intracranial internal carotid arteries are patent. 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 There is a 5 x 4 mm (measured sagittally) superiorly directed aneurysm of the paraclinoid left ICA. Middle and anterior cerebral arteries are patent. Intracranial vertebral arteries, basilar artery, posterior cerebral arteries  are patent. Incidental small fenestration of the proximal basilar artery. Bilateral posterior communicating arteries are present. MRA NECK Great vessel origins are patent. Common, internal, and external carotid arteries are patent. Extracranial vertebral arteries are patent and  codominant. There is no hemodynamically significant stenosis. 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 No occlusion or significant stenosis in the neck. No proximal intracranial vessel occlusion or significant stenosis. 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 Electronically Signed   By: Guadlupe Spanish M.D.   On: 12/12/2020 17:22   MR ANGIO NECK WO CONTRAST  Result Date: 12/12/2020 CLINICAL DATA:  Neuro deficit, acute, stroke suspected; Stroke, follow up; aphasia and facial droop EXAM: MRI HEAD WITHOUT CONTRAST MRA HEAD WITHOUT CONTRAST MRA NECK WITHOUT CONTRAST TECHNIQUE: Multiplanar, multiecho pulse sequences of the brain and surrounding structures were obtained without intravenous contrast. Angiographic images of the Circle of Willis were obtained using MRA technique without intravenous contrast. Angiographic images of the neck were obtained using MRA technique without intravenous contrast. Carotid stenosis measurements (when applicable) are obtained utilizing NASCET criteria, using the distal internal carotid diameter as the denominator. COMPARISON:  None. FINDINGS: MRI HEAD Brain: Small foci of reduced diffusion present along the lateral left precentral gyrus. There is no evidence of intracranial hemorrhage. There is no intracranial mass, mass effect, or edema. There is no hydrocephalus or extra-axial fluid collection. Ventricles and sulci are within normal limits in size and configuration. Patchy foci of T2 hyperintensity in the supratentorial white matter are nonspecific but may reflect mild chronic microvascular ischemic changes. Vascular: Major vessel flow voids at the skull base are preserved. Skull and upper cervical spine: Normal marrow signal is preserved. Sinuses/Orbits: Paranasal sinus mucosal thickening. Orbits are unremarkable. Other: Sella is unremarkable.  Mastoid air cells are clear. MRA HEAD Intracranial internal carotid  arteries are patent. 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 There is a 5 x 4 mm (measured sagittally) superiorly directed aneurysm of the paraclinoid left ICA. Middle and anterior cerebral arteries are patent. Intracranial vertebral arteries, basilar artery, posterior cerebral arteries are patent. Incidental small fenestration of the proximal basilar artery. Bilateral posterior communicating arteries are present. MRA NECK Great vessel origins are patent. Common, internal, and external carotid arteries are patent. Extracranial vertebral arteries are patent and codominant. There is no hemodynamically significant stenosis. 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 No occlusion or significant stenosis in the neck. No proximal intracranial vessel occlusion or significant stenosis. 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 Electronically Signed   By: Guadlupe Spanish M.D.   On: 12/12/2020 17:22   MR BRAIN WO CONTRAST  Result Date: 12/12/2020 CLINICAL DATA:  Neuro deficit, acute, stroke suspected; Stroke, follow up; aphasia and facial droop EXAM: MRI HEAD WITHOUT CONTRAST MRA HEAD WITHOUT CONTRAST MRA NECK WITHOUT CONTRAST TECHNIQUE: Multiplanar, multiecho pulse sequences of the brain and surrounding structures were obtained without intravenous contrast. Angiographic images of the Circle of Willis were obtained using MRA technique without intravenous contrast. Angiographic images of the neck were obtained using MRA technique without intravenous contrast. Carotid stenosis measurements (when applicable) are obtained utilizing NASCET criteria, using the distal internal carotid diameter as the denominator. COMPARISON:  None. FINDINGS: MRI HEAD Brain: Small foci of reduced diffusion present along the lateral left precentral gyrus. There is no evidence of intracranial hemorrhage. There  is no  intracranial mass, mass effect, or edema. There is no hydrocephalus or extra-axial fluid collection. Ventricles and sulci are within normal limits in size and configuration. Patchy foci of T2 hyperintensity in the supratentorial white matter are nonspecific but may reflect mild chronic microvascular ischemic changes. Vascular: Major vessel flow voids at the skull base are preserved. Skull and upper cervical spine: Normal marrow signal is preserved. Sinuses/Orbits: Paranasal sinus mucosal thickening. Orbits are unremarkable. Other: Sella is unremarkable.  Mastoid air cells are clear. MRA HEAD Intracranial internal carotid arteries are patent. 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 There is a 5 x 4 mm (measured sagittally) superiorly directed aneurysm of the paraclinoid left ICA. Middle and anterior cerebral arteries are patent. Intracranial vertebral arteries, basilar artery, posterior cerebral arteries are patent. Incidental small fenestration of the proximal basilar artery. Bilateral posterior communicating arteries are present. MRA NECK Great vessel origins are patent. Common, internal, and external carotid arteries are patent. Extracranial vertebral arteries are patent and codominant. There is no hemodynamically significant stenosis. 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 No occlusion or significant stenosis in the neck. No proximal intracranial vessel occlusion or significant stenosis. 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 Electronically Signed   By: Guadlupe Spanish M.D.   On: 12/12/2020 17:22   ECHOCARDIOGRAM COMPLETE  Result Date: 12/13/2020    ECHOCARDIOGRAM REPORT   Patient Name:   Andrea Rollins Date of Exam: 12/13/2020 Medical Rec #:  [id number]     Height:       63.0 in Accession #:    [id number]    Weight:       188.0 lb Date of Birth:  [birth year]      BSA:          1.883 m Patient Age:    70 years      BP:           124/70 mmHg Patient Gender: F             HR:           100 bpm. Exam Location:  Inpatient Procedure: 2D Echo, Cardiac Doppler, Color Doppler and Intracardiac            Opacification Agent Indications:    cva  History:        Patient has no prior history of Echocardiogram examinations.  Sonographer:    Melissa Morford RDCS (AE, PE) Referring Phys: [id number] [facility]  Sonographer Comments: Technically difficult study due to poor echo windows. and Lung interference. IMPRESSIONS  1. Left ventricular ejection fraction, by estimation, is 70 to 75%. The left ventricle has hyperdynamic function. The left ventricle has no regional wall motion abnormalities. There is moderate asymmetric left ventricular hypertrophy of the basal-septal  segment. Left ventricular diastolic parameters are consistent with Grade I diastolic dysfunction (impaired relaxation).  2. Right ventricular systolic function is normal. The right ventricular size is normal. Tricuspid regurgitation signal is inadequate for assessing PA pressure.  3. The mitral valve is normal in structure. No evidence of mitral valve regurgitation.  4. The aortic valve was not well visualized. Aortic valve regurgitation is not visualized. No aortic stenosis is present.  5. The inferior vena cava is normal in size with greater than 50% respiratory variability, suggesting right atrial pressure of 3 mmHg. FINDINGS  Left Ventricle: Left ventricular ejection fraction, by estimation, is 70  to 75%. The left ventricle has hyperdynamic function. The left ventricle has no regional wall motion abnormalities. Definity contrast agent was given IV to delineate the left ventricular endocardial borders. The left ventricular internal cavity size was small. There is moderate asymmetric left ventricular hypertrophy of the basal-septal segment. Left ventricular diastolic parameters are consistent with Grade I diastolic  dysfunction (impaired relaxation). Right Ventricle: The right ventricular size is normal. No increase in right ventricular wall thickness. Right ventricular systolic function is normal. Tricuspid regurgitation signal is inadequate for assessing PA pressure. Left Atrium: Left atrial size was normal in size. Right Atrium: Right atrial size was normal in size. Pericardium: There is no evidence of pericardial effusion. Presence of epicardial fat layer. Mitral Valve: The mitral valve is normal in structure. No evidence of mitral valve regurgitation. Tricuspid Valve: The tricuspid valve is normal in structure. Tricuspid valve regurgitation is not demonstrated. Aortic Valve: The aortic valve was not well visualized. Aortic valve regurgitation is not visualized. No aortic stenosis is present. Pulmonic Valve: The pulmonic valve was not well visualized. Pulmonic valve regurgitation is not visualized. Aorta: The aortic root is normal in size and structure. Venous: The inferior vena cava is normal in size with greater than 50% respiratory variability, suggesting right atrial pressure of 3 mmHg. IAS/Shunts: The interatrial septum was not well visualized.  LEFT VENTRICLE PLAX 2D LVIDd:         3.80 cm   Diastology LVIDs:         2.20 cm   LV e' medial:    5.66 cm/s LV PW:         1.10 cm   LV E/e' medial:  11.6 LV IVS:        1.00 cm   LV e' lateral:   4.90 cm/s LVOT diam:     2.00 cm   LV E/e' lateral: 13.4 LV SV:         62 LV SV Index:   33 LVOT Area:     3.14 cm  RIGHT VENTRICLE RV S prime:     16.30 cm/s TAPSE (M-mode): 1.2 cm LEFT ATRIUM             Index        RIGHT ATRIUM          Index LA diam:        3.50 cm 1.86 cm/m   RA Area:     8.55 cm LA Vol (A2C):   22.7 ml 12.05 ml/m  RA Volume:   14.20 ml 7.54 ml/m LA Vol (A4C):   27.1 ml 14.39 ml/m LA Biplane Vol: 25.8 ml 13.70 ml/m  AORTIC VALVE LVOT Vmax:   113.00 cm/s LVOT Vmean:  78.000 cm/s LVOT VTI:    0.198 m  AORTA Ao Root diam: 3.10 cm MITRAL VALVE MV Area  (PHT): 6.17 cm     SHUNTS MV Decel Time: 123 msec     Systemic VTI:  0.20 m MV E velocity: 65.60 cm/s   Systemic Diam: 2.00 cm MV A velocity: 107.00 cm/s MV E/A ratio:  0.61 Epifanio Lesches MD Electronically signed by Epifanio Lesches MD Signature Date/Time: 12/13/2020/11:01:37 AM    Final    CT HEAD CODE STROKE WO CONTRAST  Result Date: 12/12/2020 CLINICAL DATA:  Code stroke. Neuro deficit, acute, stroke suspected. Right facial droop, slurred speech EXAM: CT HEAD WITHOUT CONTRAST TECHNIQUE: Contiguous axial images were obtained from the base of the skull through the vertex without intravenous contrast. COMPARISON:  None. FINDINGS: Brain: There is no acute intracranial hemorrhage, mass effect, or edema. Gray-white differentiation is preserved. Possible age-indeterminate small vessel infarct or prominent perivascular space of the left thalamus. Vascular: No hyperdense vessel. Mild intracranial atherosclerotic calcification is present at the skull base. Skull: Unremarkable. Sinuses/Orbits: Patchy mucosal thickening. No significant orbital abnormality. Other: Mastoid air cells are clear. ASPECTS ([place] Stroke Program Early CT Score) - Ganglionic level infarction (caudate, lentiform nuclei, internal capsule, insula, M1-M3 cortex): 7 - Supraganglionic infarction (M4-M6 cortex): 3 Total score (0-10 with 10 being normal): 10 IMPRESSION: There is no acute intracranial hemorrhage or evidence of acute infarction. ASPECT score is 10. These results were communicated to Dr. Otelia Limes at 12:17 pm on 12/12/2020 by text page via the [facility] messaging system. Electronically Signed   By: Guadlupe Spanish M.D.   On: 12/12/2020 12:19    DISCHARGE EXAMINATION: Vitals:   12/13/20 1133 12/13/20 1200 12/13/20 1400 12/13/20 1611  BP: 119/80 130/67 119/78 128/65  Pulse: 96 95 98 100  Resp: 19 13 10 18   Temp: 97.9 F (36.6 C)   97.9 F (36.6 C)  TempSrc: Oral   Oral  SpO2: 96% 97% 97% 97%   General appearance: Awake  alert.  In no distress Resp: Clear to auscultation bilaterally.  Normal effort Cardio: S1-S2 is normal regular.  No S3-S4.  No rubs murmurs or bruit GI: Abdomen is soft.  Nontender nondistended.  Bowel sounds are present normal.  No masses organomegaly    DISPOSITION: Home  Discharge Instructions     Ambulatory referral to Neurology   Complete by: As directed    An appointment is requested in approximately: 8 weeks   Ambulatory referral to Speech Therapy   Complete by: As directed    For acute stroke with aphasia   Call MD for:  difficulty breathing, headache or visual disturbances   Complete by: As directed    Call MD for:  extreme fatigue   Complete by: As directed    Call MD for:  hives   Complete by: As directed    Call MD for:  persistant dizziness or light-headedness   Complete by: As directed    Call MD for:  persistant nausea and vomiting   Complete by: As directed    Call MD for:  severe uncontrolled pain   Complete by: As directed    Call MD for:  temperature >100.4   Complete by: As directed    Discharge instructions   Complete by: As directed    Please take your medications as prescribed.  Please be sure to follow-up with your primary care provider in 1 week.  Appointment has been made for you to see the cardiologist for heart monitor placement.  You were cared for by a hospitalist during your hospital stay. If you have any questions about your discharge medications or the care you received while you were in the hospital after you are discharged, you can call the unit and asked to speak with the hospitalist on call if the hospitalist that took care of you is not available. Once you are discharged, your primary care physician will handle any further medical issues. Please note that NO REFILLS for any discharge medications will be authorized once you are discharged, as it is imperative that you return to your primary care physician (or establish a relationship with a  primary care physician if you do not have one) for your aftercare needs so that they can reassess your need for medications and  monitor your lab values. If you do not have a primary care physician, you can call [phone] for a physician referral.   Increase activity slowly   Complete by: As directed           Allergies as of 12/13/2020   Not on File      Medication List     TAKE these medications    aspirin 81 MG EC tablet Take 1 tablet (81 mg total) by mouth daily. Swallow whole.   atorvastatin 40 MG tablet Commonly known as: LIPITOR Take 1 tablet (40 mg total) by mouth daily.   cholecalciferol 25 MCG (1000 UNIT) tablet Commonly known as: VITAMIN D3 Take 4,000 Units by mouth daily.   clopidogrel 75 MG tablet Commonly known as: PLAVIX Take 1 tablet (75 mg total) by mouth daily for 21 days. For 3 weeks only   metoprolol tartrate 25 MG tablet Commonly known as: LOPRESSOR Take 25 mg by mouth 2 (two) times daily.   spironolactone 100 MG tablet Commonly known as: ALDACTONE Take 100 mg by mouth daily.          Follow-up Information     Regan Lemming, MD Follow up.   Specialty: Cardiology Why: 01/12/21 @ 8:30AM, to discuss heart monitoring options Contact information: [address] [place] [address] [phone]         Wilmon Pali, MD. Schedule an appointment as soon as possible for a visit in 1 week(s).   Specialty: Internal Medicine Contact information: [address] [place] [address] [phone]                 TOTAL DISCHARGE TIME: 35 minutes  Angelgabriel Willmore Rito Ehrlich  [facility] Pager on [email or web address]  12/14/2020, 1:57 PM

## 2020-12-13 NOTE — ED Notes (Signed)
Breakfast orders placed 

## 2020-12-13 NOTE — ED Notes (Signed)
Admitting at [facility] updating pt/family

## 2020-12-13 NOTE — Progress Notes (Signed)
[facility] PROGRESS NOTE   Andrea Rollins WCB:[id number] DOB: [birth year] DOA: 12/12/2020  PCP: Wilmon Pali, MD  Brief History/Interval Summary: Andrea Rollins is a 70 y.o. female with medical history significant of HTN, pre-diabetes, came with new onset of aphasia and facial droop.  MRI showed acute stroke.  Patient was hospitalized for further management.     Reason for Visit: Acute stroke  Consultants: Neurology  Procedures: Transthoracic echocardiogram    Subjective/Interval History: Patient continues to have difficulties with speech.  Denies any headaches.  No chest pain or shortness of breath.     Assessment/Plan:  Acute stroke Stroke team to see the patient today.  Initially a code stroke was called but she was not a candidate for TNKase due to mild symptoms. LDL is 105.  HbA1c is 6.0. Echocardiogram shows normal systolic function with grade 1 diastolic dysfunction. Patient currently on aspirin.  Will need to be started on statin. PT OT SLP.  Essential hypertension Permissive hypertension being allowed.  Cough Respiratory status is stable.  Saturating normal on room air.  Influenza and COVID PCR negative.    DVT Prophylaxis: Lovenox Code Status: Full code Family Communication: Discussed with patient and her niece Disposition Plan: Hopefully return home when improved  Status is: Observation  The patient remains OBS appropriate and might d/c before 2 midnights.     Medications: Scheduled:  aspirin EC  81 mg Oral Daily   enoxaparin (LOVENOX) injection  40 mg Subcutaneous Q24H   LORazepam  1 mg Intravenous Once   Continuous:  sodium chloride 100 mL/hr at 12/13/20 0741   OHY:[id number] **OR** acetaminophen (TYLENOL) oral liquid 160 mg/5 mL **OR** acetaminophen, hydrALAZINE, perflutren lipid microspheres (DEFINITY) IV suspension, senna-docusate  Antibiotics: Anti-infectives (From admission, onward)    None       Objective:  Vital  Signs  Vitals:   12/13/20 0545 12/13/20 0705 12/13/20 0725 12/13/20 1133  BP:  126/73 127/82 119/80  Pulse: 84 91 91 96  Resp:   13 19  Temp:    97.9 F (36.6 C)  TempSrc:    Oral  SpO2: 98% 98% 98% 96%    Intake/Output Summary (Last 24 hours) at 12/13/2020 1146 Last data filed at 12/13/2020 0600 Gross per 24 hour  Intake 1130.38 ml  Output --  Net 1130.38 ml   There were no vitals filed for this visit.  General appearance: Awake alert.  In no distress Resp: Clear to auscultation bilaterally.  Normal effort Cardio: S1-S2 is normal regular.  No S3-S4.  No rubs murmurs or bruit GI: Abdomen is soft.  Nontender nondistended.  Bowel sounds are present normal.  No masses organomegaly Extremities: No edema.  Full range of motion of lower extremities. Neurologic: Right-sided facial droop noted.  Strength seems to be equal for the most part with perhaps a subtle weakness on the right.   Lab Results:  Data Reviewed: I have personally reviewed following labs and imaging studies  CBC: Recent Labs  Lab 12/12/20 1201 12/12/20 1210  WBC 7.5  --   NEUTROABS 5.8  --   HGB 15.2* 15.6*  HCT 46.5* 46.0  MCV 95.7  --   PLT 361  --     Basic Metabolic Panel: Recent Labs  Lab 12/12/20 1201 12/12/20 1210  NA 137 140  K 3.9 3.9  CL 106 105  CO2 24  --   GLUCOSE 161* 158*  BUN 13 15  CREATININE 1.13* 1.20*  CALCIUM 9.5  --  GFR: CrCl cannot be calculated (Unknown ideal weight.).  Liver Function Tests: Recent Labs  Lab 12/12/20 1201  AST 21  ALT 19  ALKPHOS 76  BILITOT 0.6  PROT 7.6  ALBUMIN 3.6      Coagulation Profile: Recent Labs  Lab 12/12/20 1201  INR 1.1      HbA1C: Recent Labs    12/13/20 0523  HGBA1C 6.0*    CBG: Recent Labs  Lab 12/12/20 1205  GLUCAP 145*    Lipid Profile: Recent Labs    12/13/20 0523  CHOL 161  HDL 42  LDLCALC 105*  TRIG 71  CHOLHDL 3.8     Recent Results (from the past 240 hour(s))  Resp Panel by  RT-PCR (Flu A&B, Covid) Nasopharyngeal Swab     Status: None   Collection Time: 12/12/20  4:16 PM   Specimen: Nasopharyngeal Swab; Nasopharyngeal(NP) swabs in vial transport medium  Result Value Ref Range Status   SARS Coronavirus 2 by RT PCR NEGATIVE NEGATIVE Final    Comment: (NOTE) SARS-CoV-2 target nucleic acids are NOT DETECTED.  The SARS-CoV-2 RNA is generally detectable in upper respiratory specimens during the acute phase of infection. The lowest concentration of SARS-CoV-2 viral copies this assay can detect is 138 copies/mL. A negative result does not preclude SARS-Cov-2 infection and should not be used as the sole basis for treatment or other patient management decisions. A negative result may occur with  improper specimen collection/handling, submission of specimen other than nasopharyngeal swab, presence of viral mutation(s) within the areas targeted by this assay, and inadequate number of viral copies(<138 copies/mL). A negative result must be combined with clinical observations, patient history, and epidemiological information. The expected result is Negative.  Fact Sheet for Patients:  BloggerCourse.com  Fact Sheet for Healthcare Providers:  SeriousBroker.it  This test is no t yet approved or cleared by the [place] FDA and  has been authorized for detection and/or diagnosis of SARS-CoV-2 by FDA under an Emergency Use Authorization (EUA). This EUA will remain  in effect (meaning this test can be used) for the duration of the COVID-19 declaration under Section 564(b)(1) of the Act, 21 U.S.C.section 360bbb-3(b)(1), unless the authorization is terminated  or revoked sooner.       Influenza A by PCR NEGATIVE NEGATIVE Final   Influenza B by PCR NEGATIVE NEGATIVE Final    Comment: (NOTE) The Xpert Xpress SARS-CoV-2/FLU/RSV plus assay is intended as an aid in the diagnosis of influenza from Nasopharyngeal swab  specimens and should not be used as a sole basis for treatment. Nasal washings and aspirates are unacceptable for Xpert Xpress SARS-CoV-2/FLU/RSV testing.  Fact Sheet for Patients: BloggerCourse.com  Fact Sheet for Healthcare Providers: SeriousBroker.it  This test is not yet approved or cleared by the [place] FDA and has been authorized for detection and/or diagnosis of SARS-CoV-2 by FDA under an Emergency Use Authorization (EUA). This EUA will remain in effect (meaning this test can be used) for the duration of the COVID-19 declaration under Section 564(b)(1) of the Act, 21 U.S.C. section 360bbb-3(b)(1), unless the authorization is terminated or revoked.  Performed at [facility], [address] [place], [place] [address]       Radiology Studies: MR ANGIO HEAD WO CONTRAST  Result Date: 12/12/2020 CLINICAL DATA:  Neuro deficit, acute, stroke suspected; Stroke, follow up; aphasia and facial droop EXAM: MRI HEAD WITHOUT CONTRAST MRA HEAD WITHOUT CONTRAST MRA NECK WITHOUT CONTRAST TECHNIQUE: Multiplanar, multiecho pulse sequences of the brain and surrounding  structures were obtained without intravenous contrast. Angiographic images of the Circle of Willis were obtained using MRA technique without intravenous contrast. Angiographic images of the neck were obtained using MRA technique without intravenous contrast. Carotid stenosis measurements (when applicable) are obtained utilizing NASCET criteria, using the distal internal carotid diameter as the denominator. COMPARISON:  None. FINDINGS: MRI HEAD Brain: Small foci of reduced diffusion present along the lateral left precentral gyrus. There is no evidence of intracranial hemorrhage. There is no intracranial mass, mass effect, or edema. There is no hydrocephalus or extra-axial fluid collection. Ventricles and sulci are within normal limits in size and configuration. Patchy foci of  T2 hyperintensity in the supratentorial white matter are nonspecific but may reflect mild chronic microvascular ischemic changes. Vascular: Major vessel flow voids at the skull base are preserved. Skull and upper cervical spine: Normal marrow signal is preserved. Sinuses/Orbits: Paranasal sinus mucosal thickening. Orbits are unremarkable. Other: Sella is unremarkable.  Mastoid air cells are clear. MRA HEAD Intracranial internal carotid arteries are patent. 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 There is a 5 x 4 mm (measured sagittally) superiorly directed aneurysm of the paraclinoid left ICA. Middle and anterior cerebral arteries are patent. Intracranial vertebral arteries, basilar artery, posterior cerebral arteries are patent. Incidental small fenestration of the proximal basilar artery. Bilateral posterior communicating arteries are present. MRA NECK Great vessel origins are patent. Common, internal, and external carotid arteries are patent. Extracranial vertebral arteries are patent and codominant. There is no hemodynamically significant stenosis. 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 No occlusion or significant stenosis in the neck. No proximal intracranial vessel occlusion or significant stenosis. 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 Electronically Signed   By: Guadlupe Spanish M.D.   On: 12/12/2020 17:22   MR ANGIO NECK WO CONTRAST  Result Date: 12/12/2020 CLINICAL DATA:  Neuro deficit, acute, stroke suspected; Stroke, follow up; aphasia and facial droop EXAM: MRI HEAD WITHOUT CONTRAST MRA HEAD WITHOUT CONTRAST MRA NECK WITHOUT CONTRAST TECHNIQUE: Multiplanar, multiecho pulse sequences of the brain and surrounding structures were obtained without intravenous contrast. Angiographic images of the Circle of Willis were obtained using MRA technique without  intravenous contrast. Angiographic images of the neck were obtained using MRA technique without intravenous contrast. Carotid stenosis measurements (when applicable) are obtained utilizing NASCET criteria, using the distal internal carotid diameter as the denominator. COMPARISON:  None. FINDINGS: MRI HEAD Brain: Small foci of reduced diffusion present along the lateral left precentral gyrus. There is no evidence of intracranial hemorrhage. There is no intracranial mass, mass effect, or edema. There is no hydrocephalus or extra-axial fluid collection. Ventricles and sulci are within normal limits in size and configuration. Patchy foci of T2 hyperintensity in the supratentorial white matter are nonspecific but may reflect mild chronic microvascular ischemic changes. Vascular: Major vessel flow voids at the skull base are preserved. Skull and upper cervical spine: Normal marrow signal is preserved. Sinuses/Orbits: Paranasal sinus mucosal thickening. Orbits are unremarkable. Other: Sella is unremarkable.  Mastoid air cells are clear. MRA HEAD Intracranial internal carotid arteries are patent. 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 There is a 5 x 4 mm (measured sagittally) superiorly directed aneurysm of the paraclinoid left ICA. Middle and anterior cerebral arteries are patent. Intracranial vertebral arteries, basilar artery, posterior cerebral arteries are  patent. Incidental small fenestration of the proximal basilar artery. Bilateral posterior communicating arteries are present. MRA NECK Great vessel origins are patent. Common, internal, and external carotid arteries are patent. Extracranial vertebral arteries are patent and codominant. There is no hemodynamically significant stenosis. 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 No occlusion or significant stenosis in the neck. No proximal intracranial vessel occlusion  or significant stenosis. 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 Electronically Signed   By: Guadlupe Spanish M.D.   On: 12/12/2020 17:22   MR BRAIN WO CONTRAST  Result Date: 12/12/2020 CLINICAL DATA:  Neuro deficit, acute, stroke suspected; Stroke, follow up; aphasia and facial droop EXAM: MRI HEAD WITHOUT CONTRAST MRA HEAD WITHOUT CONTRAST MRA NECK WITHOUT CONTRAST TECHNIQUE: Multiplanar, multiecho pulse sequences of the brain and surrounding structures were obtained without intravenous contrast. Angiographic images of the Circle of Willis were obtained using MRA technique without intravenous contrast. Angiographic images of the neck were obtained using MRA technique without intravenous contrast. Carotid stenosis measurements (when applicable) are obtained utilizing NASCET criteria, using the distal internal carotid diameter as the denominator. COMPARISON:  None. FINDINGS: MRI HEAD Brain: Small foci of reduced diffusion present along the lateral left precentral gyrus. There is no evidence of intracranial hemorrhage. There is no intracranial mass, mass effect, or edema. There is no hydrocephalus or extra-axial fluid collection. Ventricles and sulci are within normal limits in size and configuration. Patchy foci of T2 hyperintensity in the supratentorial white matter are nonspecific but may reflect mild chronic microvascular ischemic changes. Vascular: Major vessel flow voids at the skull base are preserved. Skull and upper cervical spine: Normal marrow signal is preserved. Sinuses/Orbits: Paranasal sinus mucosal thickening. Orbits are unremarkable. Other: Sella is unremarkable.  Mastoid air cells are clear. MRA HEAD Intracranial internal carotid arteries are patent. 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 There is a 5 x 4 mm (measured sagittally) superiorly directed aneurysm of the  paraclinoid left ICA. Middle and anterior cerebral arteries are patent. Intracranial vertebral arteries, basilar artery, posterior cerebral arteries are patent. Incidental small fenestration of the proximal basilar artery. Bilateral posterior communicating arteries are present. MRA NECK Great vessel origins are patent. Common, internal, and external carotid arteries are patent. Extracranial vertebral arteries are patent and codominant. There is no hemodynamically significant stenosis. 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 No occlusion or significant stenosis in the neck. No proximal intracranial vessel occlusion or significant stenosis. 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 Electronically Signed   By: Guadlupe Spanish M.D.   On: 12/12/2020 17:22   ECHOCARDIOGRAM COMPLETE  Result Date: 12/13/2020    ECHOCARDIOGRAM REPORT   Patient Name:   Andrea Rollins Date of Exam: 12/13/2020 Medical Rec #:  [id number]     Height:       63.0 in Accession #:    [id number]    Weight:       188.0 lb Date of Birth:  03-Apr-1950     BSA:          1.883 m Patient Age:    70 years      BP:           124/70 mmHg Patient Gender: F             HR:  100 bpm. Exam Location:  Inpatient Procedure: 2D Echo, Cardiac Doppler, Color Doppler and Intracardiac            Opacification Agent Indications:    cva  History:        Patient has no prior history of Echocardiogram examinations.  Sonographer:    Melissa Morford RDCS (AE, PE) Referring Phys: [id number] [place] General  Sonographer Comments: Technically difficult study due to poor echo windows. and Lung interference. IMPRESSIONS  1. Left ventricular ejection fraction, by estimation, is 70 to 75%. The left ventricle has hyperdynamic function. The left ventricle has no regional wall motion abnormalities. There is moderate asymmetric left ventricular hypertrophy of the basal-septal  segment. Left ventricular diastolic parameters  are consistent with Grade I diastolic dysfunction (impaired relaxation).  2. Right ventricular systolic function is normal. The right ventricular size is normal. Tricuspid regurgitation signal is inadequate for assessing PA pressure.  3. The mitral valve is normal in structure. No evidence of mitral valve regurgitation.  4. The aortic valve was not well visualized. Aortic valve regurgitation is not visualized. No aortic stenosis is present.  5. The inferior vena cava is normal in size with greater than 50% respiratory variability, suggesting right atrial pressure of 3 mmHg. FINDINGS  Left Ventricle: Left ventricular ejection fraction, by estimation, is 70 to 75%. The left ventricle has hyperdynamic function. The left ventricle has no regional wall motion abnormalities. Definity contrast agent was given IV to delineate the left ventricular endocardial borders. The left ventricular internal cavity size was small. There is moderate asymmetric left ventricular hypertrophy of the basal-septal segment. Left ventricular diastolic parameters are consistent with Grade I diastolic dysfunction (impaired relaxation). Right Ventricle: The right ventricular size is normal. No increase in right ventricular wall thickness. Right ventricular systolic function is normal. Tricuspid regurgitation signal is inadequate for assessing PA pressure. Left Atrium: Left atrial size was normal in size. Right Atrium: Right atrial size was normal in size. Pericardium: There is no evidence of pericardial effusion. Presence of epicardial fat layer. Mitral Valve: The mitral valve is normal in structure. No evidence of mitral valve regurgitation. Tricuspid Valve: The tricuspid valve is normal in structure. Tricuspid valve regurgitation is not demonstrated. Aortic Valve: The aortic valve was not well visualized. Aortic valve regurgitation is not visualized. No aortic stenosis is present. Pulmonic Valve: The pulmonic valve was not well visualized.  Pulmonic valve regurgitation is not visualized. Aorta: The aortic root is normal in size and structure. Venous: The inferior vena cava is normal in size with greater than 50% respiratory variability, suggesting right atrial pressure of 3 mmHg. IAS/Shunts: The interatrial septum was not well visualized.  LEFT VENTRICLE PLAX 2D LVIDd:         3.80 cm   Diastology LVIDs:         2.20 cm   LV e' medial:    5.66 cm/s LV PW:         1.10 cm   LV E/e' medial:  11.6 LV IVS:        1.00 cm   LV e' lateral:   4.90 cm/s LVOT diam:     2.00 cm   LV E/e' lateral: 13.4 LV SV:         62 LV SV Index:   33 LVOT Area:     3.14 cm  RIGHT VENTRICLE RV S prime:     16.30 cm/s TAPSE (M-mode): 1.2 cm LEFT ATRIUM  Index        RIGHT ATRIUM          Index LA diam:        3.50 cm 1.86 cm/m   RA Area:     8.55 cm LA Vol (A2C):   22.7 ml 12.05 ml/m  RA Volume:   14.20 ml 7.54 ml/m LA Vol (A4C):   27.1 ml 14.39 ml/m LA Biplane Vol: 25.8 ml 13.70 ml/m  AORTIC VALVE LVOT Vmax:   113.00 cm/s LVOT Vmean:  78.000 cm/s LVOT VTI:    0.198 m  AORTA Ao Root diam: 3.10 cm MITRAL VALVE MV Area (PHT): 6.17 cm     SHUNTS MV Decel Time: 123 msec     Systemic VTI:  0.20 m MV E velocity: 65.60 cm/s   Systemic Diam: 2.00 cm MV A velocity: 107.00 cm/s MV E/A ratio:  0.61 Epifanio Lesches MD Electronically signed by Epifanio Lesches MD Signature Date/Time: 12/13/2020/11:01:37 AM    Final    CT HEAD CODE STROKE WO CONTRAST  Result Date: 12/12/2020 CLINICAL DATA:  Code stroke. Neuro deficit, acute, stroke suspected. Right facial droop, slurred speech EXAM: CT HEAD WITHOUT CONTRAST TECHNIQUE: Contiguous axial images were obtained from the base of the skull through the vertex without intravenous contrast. COMPARISON:  None. FINDINGS: Brain: There is no acute intracranial hemorrhage, mass effect, or edema. Gray-white differentiation is preserved. Possible age-indeterminate small vessel infarct or prominent perivascular space of the left  thalamus. Vascular: No hyperdense vessel. Mild intracranial atherosclerotic calcification is present at the skull base. Skull: Unremarkable. Sinuses/Orbits: Patchy mucosal thickening. No significant orbital abnormality. Other: Mastoid air cells are clear. ASPECTS ([place] Stroke Program Early CT Score) - Ganglionic level infarction (caudate, lentiform nuclei, internal capsule, insula, M1-M3 cortex): 7 - Supraganglionic infarction (M4-M6 cortex): 3 Total score (0-10 with 10 being normal): 10 IMPRESSION: There is no acute intracranial hemorrhage or evidence of acute infarction. ASPECT score is 10. These results were communicated to Dr. Otelia Limes at 12:17 pm on 12/12/2020 by text page via the [facility] messaging system. Electronically Signed   By: Guadlupe Spanish M.D.   On: 12/12/2020 12:19       LOS: 0 days   Alessio Bogan Rito Ehrlich  [facility] Pager on [email or web address]  12/13/2020, 11:46 AM

## 2020-12-13 NOTE — Evaluation (Signed)
Speech Language Pathology Evaluation Patient Details Name: Andrea Rollins MRN: [id number] DOB: 1950-08-02 Today's Date: 12/13/2020 Time: 9528-4132 SLP Time Calculation (min) (ACUTE ONLY): 16 min  Problem List:  Patient Active Problem List   Diagnosis Date Noted   Stroke (cerebrum) (HCC) 12/12/2020   Past Medical History: No past medical history on file. HPI Pt is a 70 year old woman admitted on 12/12/20 with R UE weakness and speech deficits. MRI + small acute infartcs L precentral gyrus. PMH: HTN, prediabetes, CKD3, HLD.   Assessment / Plan / Recommendation Clinical Impression  Pt presents with a mild-moderate dysarthria of speech with excellent recognition of errors and attempts to self-correct.  Language assessment reveals fluent output with no expressive errors; naming WNL except mild difficulty with generative naming; normal grammatical form. Excellent auditory comprehension. Pt will benefit from OP SLP evaluation - she verbalizes agreement. No further in-house SLP f/u is needed.    SLP Assessment  SLP Recommendation/Assessment: Patient does not need any further Speech Lanaguage Pathology Services SLP Visit Diagnosis: Cognitive communication deficit (R41.841)    Recommendations for follow up therapy are one component of a multi-disciplinary discharge planning process, led by the attending physician.  Recommendations may be updated based on patient status, additional functional criteria and insurance authorization.    Follow Up Recommendations  Outpatient SLP    Assistance Recommended at Discharge   none                 SLP Evaluation Cognition  Overall Cognitive Status: Within Functional Limits for tasks assessed Orientation Level: Oriented to place;Oriented to time       Comprehension  Auditory Comprehension Overall Auditory Comprehension: Appears within functional limits for tasks assessed Yes/No Questions: Within Functional Limits Commands: Within Functional  Limits Conversation: Complex Visual Recognition/Discrimination Discrimination: Within Function Limits    Expression Expression Primary Mode of Expression: Verbal Verbal Expression Overall Verbal Expression: Impaired Initiation: No impairment Automatic Speech: Name;Social Response Level of Generative/Spontaneous Verbalization: Conversation Repetition: No impairment Naming: Impairment Responsive: 76-100% accurate Confrontation: Within functional limits Convergent: 75-100% accurate Divergent: 25-49% accurate Pragmatics: No impairment Written Expression Dominant Hand: Right Written Expression: Not tested   Oral / Motor  Oral Motor/Sensory Function Overall Oral Motor/Sensory Function: Mild impairment Facial ROM: Suspected CN VII (facial) dysfunction Facial Symmetry: Suspected CN VII (facial) dysfunction Facial Strength: Suspected CN VII (facial) dysfunction Facial Sensation: Within Functional Limits Lingual Symmetry: Within Functional Limits Lingual Sensation: Within Functional Limits Motor Speech Overall Motor Speech: Impaired Respiration: Within functional limits Phonation: Normal Resonance: Within functional limits Articulation: Impaired Level of Impairment: Sentence Intelligibility: Intelligibility reduced Word: 75-100% accurate Phrase: 75-100% accurate Sentence: 75-100% accurate Conversation: 75-100% accurate Motor Planning: Witnin functional limits Motor Speech Errors: Not applicable                     Andrea Rollins L. Samson Frederic, MA CCC/SLP Acute Rehabilitation Services Office number [phone] Pager [phone]  Andrea Rollins Andrea Rollins 12/13/2020, 3:04 PM

## 2020-12-13 NOTE — ED Notes (Signed)
Stroke educational book given to the patient.

## 2020-12-13 NOTE — Evaluation (Addendum)
Physical Therapy Evaluation and Discharge Patient Details Name: Andrea Rollins MRN: [id number] DOB: [birth year] Today's Date: 12/13/2020  History of Present Illness  Pt is a 70 year old woman admitted on 12/12/20 with R UE weakness and speech deficits. 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 PMH: HTN, prediabetes, CKD3, HLD.  Clinical Impression  Patient evaluated by Physical Therapy with no further acute PT needs identified. All education has been completed and the patient has no further questions. Pt overall steady with mobility tasks. Able to perform DGI tasks without LOB. Continued to note speech deficits, so would benefit from SLP follow up. See below for any follow-up Physical Therapy or equipment needs. PT is signing off. Thank you for this referral. If needs change, please re-consult.         Recommendations for follow up therapy are one component of a multi-disciplinary discharge planning process, led by the attending physician.  Recommendations may be updated based on patient status, additional functional criteria and insurance authorization.  Follow Up Recommendations No PT follow up    Assistance Recommended at Discharge Intermittent Supervision/Assistance  Functional Status Assessment Patient has had a recent decline in their functional status and demonstrates the ability to make significant improvements in function in a reasonable and predictable amount of time.  Equipment Recommendations  None recommended by PT    Recommendations for Other Services       Precautions / Restrictions Precautions Precautions: Fall Restrictions Weight Bearing Restrictions: No      Mobility  Bed Mobility Overal bed mobility: Modified Independent                  Transfers Overall transfer level: Independent                      Ambulation/Gait Ambulation/Gait assistance: Independent Gait Distance (Feet): 150 Feet Assistive device: None Gait Pattern/deviations:  WFL(Within Functional Limits) Gait velocity: WFL     General Gait Details: Able to perform DGI tasks without LOB.  Stairs            Wheelchair Mobility    Modified Rankin (Stroke Patients Only) Modified Rankin (Stroke Patients Only) Pre-Morbid Rankin Score: No symptoms Modified Rankin: No significant disability     Balance Overall balance assessment: Modified Independent                               Standardized Balance Assessment Standardized Balance Assessment : Dynamic Gait Index   Dynamic Gait Index Level Surface: Normal Change in Gait Speed: Normal Gait with Horizontal Head Turns: Normal Gait with Vertical Head Turns: Normal Gait and Pivot Turn: Normal Step Over Obstacle: Normal Step Around Obstacles: Normal       Pertinent Vitals/Pain Pain Assessment: No/denies pain    Home Living Family/patient expects to be discharged to:: Private residence Living Arrangements: Other relatives (sister, brother in Social worker, and nieces) Available Help at Discharge: Family Type of Home: House Home Access: Stairs to enter Entrance Stairs-Rails: Right;Left;Can reach both Secretary/administrator of Steps: 6 Alternate Level Stairs-Number of Steps: flight Home Layout: Two level Home Equipment: None      Prior Function Prior Level of Function : Independent/Modified Independent;Driving                     Hand Dominance   Dominant Hand: Right    Extremity/Trunk Assessment   Upper Extremity Assessment Upper Extremity Assessment: Defer to OT  evaluation    Lower Extremity Assessment Lower Extremity Assessment: Overall WFL for tasks assessed    Cervical / Trunk Assessment Cervical / Trunk Assessment: Normal  Communication   Communication: Expressive difficulties  Cognition Arousal/Alertness: Awake/alert Behavior During Therapy: WFL for tasks assessed/performed Overall Cognitive Status: Within Functional Limits for tasks assessed                                           General Comments General comments (skin integrity, edema, etc.): Pt's sister present throughout    Exercises     Assessment/Plan    PT Assessment Patient does not need any further PT services  PT Problem List         PT Treatment Interventions      PT Goals (Current goals can be found in the Care Plan section)  Acute Rehab PT Goals Patient Stated Goal: to go home PT Goal Formulation: With patient Time For Goal Achievement: 12/13/20 Potential to Achieve Goals: Good    Frequency     Barriers to discharge        Co-evaluation               AM-PAC PT "6 Clicks" Mobility  Outcome Measure Help needed turning from your back to your side while in a flat bed without using bedrails?: None Help needed moving from lying on your back to sitting on the side of a flat bed without using bedrails?: None Help needed moving to and from a bed to a chair (including a wheelchair)?: None Help needed standing up from a chair using your arms (e.g., wheelchair or bedside chair)?: None Help needed to walk in hospital room?: None Help needed climbing 3-5 steps with a railing? : None 6 Click Score: 24    End of Session   Activity Tolerance: Patient tolerated treatment well Patient left: in bed;with call bell/phone within reach;with family/visitor present (on stretcher in ED) Nurse Communication: Mobility status PT Visit Diagnosis: Other symptoms and signs involving the nervous system (U88.280)    Time: 0349-1791 PT Time Calculation (min) (ACUTE ONLY): 20 min   Charges:   PT Evaluation $PT Eval Low Complexity: 1 Low          Cindee Salt, DPT  Acute Rehabilitation Services  Pager: [phone] Office: [phone]   Lehman Prom 12/13/2020, 12:29 PM

## 2020-12-13 NOTE — Evaluation (Signed)
Occupational Therapy Evaluation and Discharge Patient Details Name: Andrea Rollins MRN: [id number] DOB: 1950/02/26 Today's Date: 12/13/2020   History of Present Illness Pt is a 70 year old woman admitted on 12/12/20 with R UE weakness and speech deficits. MRI + small acute infartcs L precentral gyrus. PMH: HTN, prediabetes, CKD3, HLD.   Clinical Impression   Pt is functioning independently without focal weakness. Pt reports her only continuing deficit is in her speech. No OT needs.      Recommendations for follow up therapy are one component of a multi-disciplinary discharge planning process, led by the attending physician.  Recommendations may be updated based on patient status, additional functional criteria and insurance authorization.   Follow Up Recommendations  No OT follow up    Assistance Recommended at Discharge None  Functional Status Assessment  Patient has had a recent decline in their functional status and demonstrates the ability to make significant improvements in function in a reasonable and predictable amount of time.  Equipment Recommendations  None recommended by OT    Recommendations for Other Services       Precautions / Restrictions Precautions Precautions: Fall Restrictions Weight Bearing Restrictions: No      Mobility Bed Mobility Overal bed mobility: Modified Independent             General bed mobility comments: HOB up    Transfers Overall transfer level: Independent                        Balance Overall balance assessment: Modified Independent                               Standardized Balance Assessment Standardized Balance Assessment : Dynamic Gait Index        ADL either performed or assessed with clinical judgement   ADL Overall ADL's : Independent                                             Vision Baseline Vision/History: 1 Wears glasses Ability to See in Adequate Light: 0  Adequate       Perception     Praxis      Pertinent Vitals/Pain Pain Assessment: No/denies pain     Hand Dominance Right   Extremity/Trunk Assessment Upper Extremity Assessment Upper Extremity Assessment: Overall WFL for tasks assessed   Lower Extremity Assessment Lower Extremity Assessment: Defer to PT evaluation   Cervical / Trunk Assessment Cervical / Trunk Assessment: Normal   Communication Communication Communication: Expressive difficulties   Cognition Arousal/Alertness: Awake/alert Behavior During Therapy: WFL for tasks assessed/performed Overall Cognitive Status: Within Functional Limits for tasks assessed                                       General Comments  Pt's sister present throughout    Exercises     Shoulder Instructions      Home Living Family/patient expects to be discharged to:: Private residence Living Arrangements: Other relatives (sister, brother in law, and nieces) Available Help at Discharge: Family;Available 24 hours/day Type of Home: House Home Access: Stairs to enter [employer] of Steps: 6 Entrance Stairs-Rails: Right;Left;Can reach both Home Layout: Two level Alternate Level Stairs-Number of  Steps: flight Alternate Level Stairs-Rails: Right;Left Bathroom Shower/Tub: Chief Strategy Officer: Standard     Home Equipment: None   Additional Comments: lives in the basement with a half bath      Prior Functioning/Environment Prior Level of Function : Independent/Modified Independent;Driving                        OT Problem List:        OT Treatment/Interventions:      OT Goals(Current goals can be found in the care plan section)    OT Frequency:     Barriers to D/C:            Co-evaluation              AM-PAC OT "6 Clicks" Daily Activity     Outcome Measure Help from another person eating meals?: None Help from another person taking care of personal  grooming?: None Help from another person toileting, which includes using toliet, bedpan, or urinal?: None Help from another person bathing (including washing, rinsing, drying)?: None Help from another person to put on and taking off regular upper body clothing?: None Help from another person to put on and taking off regular lower body clothing?: None 6 Click Score: 24   End of Session Equipment Utilized During Treatment: Gait belt  Activity Tolerance: Patient tolerated treatment well Patient left: in bed;with call bell/phone within reach;with family/visitor present  OT Visit Diagnosis: Muscle weakness (generalized) (M62.81)                Time: 1638-4536 OT Time Calculation (min): 20 min Charges:  OT General Charges $OT Visit: 1 Visit OT Evaluation $OT Eval Low Complexity: 1 Low  Martie Round, OTR/L Acute Rehabilitation Services Pager: [phone] Office: [phone]  Evern Bio 12/13/2020, 1:00 PM

## 2020-12-13 NOTE — Progress Notes (Addendum)
STROKE TEAM PROGRESS NOTE   INTERVAL HISTORY Patient is seen in her room with no family at the bedside.  Yesterday, she had an episode with aphasia, right sided facial droop and right arm weakness.  She presented to the ED and a code stroke was called.  As her NIHSS was 5 on presentation, risks of TNK were felt to outweigh potential benefits.  She continues to have mild expressive aphasia right-sided weakness has improved.  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  Echocardiogram is pending.  LDL cholesterol is 105 mg percent and hemoglobin A1c 6.0. Vitals:   12/13/20 0725 12/13/20 1133 12/13/20 1200 12/13/20 1400  BP: 127/82 119/80 130/67 119/78  Pulse: 91 96 95 98  Resp: 13 19 13 10   Temp:  97.9 F (36.6 C)    TempSrc:  Oral    SpO2: 98% 96% 97% 97%   CBC:  Recent Labs  Lab 12/12/20 1201 12/12/20 1210  WBC 7.5  --   NEUTROABS 5.8  --   HGB 15.2* 15.6*  HCT 46.5* 46.0  MCV 95.7  --   PLT 361  --    Basic Metabolic Panel:  Recent Labs  Lab 12/12/20 1201 12/12/20 1210  NA 137 140  K 3.9 3.9  CL 106 105  CO2 24  --   GLUCOSE 161* 158*  BUN 13 15  CREATININE 1.13* 1.20*  CALCIUM 9.5  --    Lipid Panel:  Recent Labs  Lab 12/13/20 0523  CHOL 161  TRIG 71  HDL 42  CHOLHDL 3.8  VLDL 14  LDLCALC 105*   HgbA1c:  Recent Labs  Lab 12/13/20 0523  HGBA1C 6.0*   Urine Drug Screen: No results for input(s): LABOPIA, COCAINSCRNUR, LABBENZ, AMPHETMU, THCU, LABBARB in the last 168 hours.  Alcohol Level No results for input(s): ETH in the last 168 hours.  IMAGING past 24 hours MR ANGIO HEAD WO CONTRAST  Result Date: 12/12/2020 CLINICAL DATA:  Neuro deficit, acute, stroke suspected; Stroke, follow up; aphasia and facial droop EXAM: MRI HEAD WITHOUT CONTRAST MRA HEAD WITHOUT CONTRAST MRA NECK WITHOUT CONTRAST TECHNIQUE: Multiplanar, multiecho pulse sequences of  the brain and surrounding structures were obtained without intravenous contrast. Angiographic images of the Circle of Willis were obtained using MRA technique without intravenous contrast. Angiographic images of the neck were obtained using MRA technique without intravenous contrast. Carotid stenosis measurements (when applicable) are obtained utilizing NASCET criteria, using the distal internal carotid diameter as the denominator. COMPARISON:  None. FINDINGS: MRI HEAD Brain: Small foci of reduced diffusion present along the lateral left precentral gyrus. There is no evidence of intracranial hemorrhage. There is no intracranial mass, mass effect, or edema. There is no hydrocephalus or extra-axial fluid collection. Ventricles and sulci are within normal limits in size and configuration. Patchy foci of T2 hyperintensity in the supratentorial white matter are nonspecific but may reflect mild chronic microvascular ischemic changes. Vascular: Major vessel flow voids at the skull base are preserved. Skull and upper cervical spine: Normal marrow signal is preserved. Sinuses/Orbits: Paranasal sinus mucosal thickening. Orbits are unremarkable. Other: Sella is unremarkable.  Mastoid air cells are clear. MRA HEAD Intracranial internal carotid arteries are patent. 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 There is a 5 x 4 mm (measured sagittally) superiorly directed aneurysm of the paraclinoid left ICA. Middle and  anterior cerebral arteries are patent. Intracranial vertebral arteries, basilar artery, posterior cerebral arteries are patent. Incidental small fenestration of the proximal basilar artery. Bilateral posterior communicating arteries are present. MRA NECK Great vessel origins are patent. Common, internal, and external carotid arteries are patent. Extracranial vertebral arteries are patent and codominant. There is no hemodynamically  significant stenosis. 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 No occlusion or significant stenosis in the neck. No proximal intracranial vessel occlusion or significant stenosis. 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 Electronically Signed   By: Guadlupe Spanish M.D.   On: 12/12/2020 17:22   MR ANGIO NECK WO CONTRAST  Result Date: 12/12/2020 CLINICAL DATA:  Neuro deficit, acute, stroke suspected; Stroke, follow up; aphasia and facial droop EXAM: MRI HEAD WITHOUT CONTRAST MRA HEAD WITHOUT CONTRAST MRA NECK WITHOUT CONTRAST TECHNIQUE: Multiplanar, multiecho pulse sequences of the brain and surrounding structures were obtained without intravenous contrast. Angiographic images of the Circle of Willis were obtained using MRA technique without intravenous contrast. Angiographic images of the neck were obtained using MRA technique without intravenous contrast. Carotid stenosis measurements (when applicable) are obtained utilizing NASCET criteria, using the distal internal carotid diameter as the denominator. COMPARISON:  None. FINDINGS: MRI HEAD Brain: Small foci of reduced diffusion present along the lateral left precentral gyrus. There is no evidence of intracranial hemorrhage. There is no intracranial mass, mass effect, or edema. There is no hydrocephalus or extra-axial fluid collection. Ventricles and sulci are within normal limits in size and configuration. Patchy foci of T2 hyperintensity in the supratentorial white matter are nonspecific but may reflect mild chronic microvascular ischemic changes. Vascular: Major vessel flow voids at the skull base are preserved. Skull and upper cervical spine: Normal marrow signal is preserved. Sinuses/Orbits: Paranasal sinus mucosal thickening. Orbits are unremarkable. Other: Sella is unremarkable.  Mastoid air cells are clear. MRA HEAD Intracranial internal carotid arteries are patent. There is a 3 x 2 mm  (measured axially) medially and inferiorly directed aneurysm of the proximal supraclinoid right ICA. There is a 5 x 4 mm (measured sagittally) superiorly directed aneurysm of the paraclinoid left ICA. Middle and anterior cerebral arteries are patent. Intracranial vertebral arteries, basilar artery, posterior cerebral arteries are patent. Incidental small fenestration of the proximal basilar artery. Bilateral posterior communicating arteries are present. MRA NECK Great vessel origins are patent. Common, internal, and external carotid arteries are patent. Extracranial vertebral arteries are patent and codominant. There is no hemodynamically significant stenosis. 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 No occlusion or significant stenosis in the neck. No proximal intracranial vessel occlusion or significant stenosis. 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 Electronically Signed   By: Guadlupe Spanish M.D.   On: 12/12/2020 17:22   MR BRAIN WO CONTRAST  Result Date: 12/12/2020 CLINICAL DATA:  Neuro deficit, acute, stroke suspected; Stroke, follow up; aphasia and facial droop EXAM: MRI HEAD WITHOUT CONTRAST MRA HEAD WITHOUT CONTRAST MRA NECK WITHOUT CONTRAST TECHNIQUE: Multiplanar, multiecho pulse sequences of the brain and surrounding structures were obtained without intravenous contrast. Angiographic images of the Circle of Willis were obtained using MRA technique without intravenous contrast. Angiographic images of the neck were obtained using MRA technique without intravenous contrast. Carotid stenosis measurements (when applicable) are obtained utilizing NASCET criteria, using the distal internal carotid diameter as the denominator. COMPARISON:  None. FINDINGS: MRI HEAD Brain: Small foci of reduced diffusion present along the lateral left  precentral gyrus. There is no evidence of intracranial hemorrhage. There is no intracranial mass, mass effect, or  edema. There is no hydrocephalus or extra-axial fluid collection. Ventricles and sulci are within normal limits in size and configuration. Patchy foci of T2 hyperintensity in the supratentorial white matter are nonspecific but may reflect mild chronic microvascular ischemic changes. Vascular: Major vessel flow voids at the skull base are preserved. Skull and upper cervical spine: Normal marrow signal is preserved. Sinuses/Orbits: Paranasal sinus mucosal thickening. Orbits are unremarkable. Other: Sella is unremarkable.  Mastoid air cells are clear. MRA HEAD Intracranial internal carotid arteries are patent. 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 There is a 5 x 4 mm (measured sagittally) superiorly directed aneurysm of the paraclinoid left ICA. Middle and anterior cerebral arteries are patent. Intracranial vertebral arteries, basilar artery, posterior cerebral arteries are patent. Incidental small fenestration of the proximal basilar artery. Bilateral posterior communicating arteries are present. MRA NECK Great vessel origins are patent. Common, internal, and external carotid arteries are patent. Extracranial vertebral arteries are patent and codominant. There is no hemodynamically significant stenosis. 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 No occlusion or significant stenosis in the neck. No proximal intracranial vessel occlusion or significant stenosis. 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 Electronically Signed   By: Macy Mis M.D.   On: 12/12/2020 17:22   ECHOCARDIOGRAM COMPLETE  Result Date: 12/13/2020    ECHOCARDIOGRAM REPORT   Patient Name:   Andrea Rollins Date of Exam: 12/13/2020 Medical Rec #:  [id number]     Height:       63.0 in Accession #:    [id number]    Weight:       188.0 lb Date of Birth:  04/25/1950     BSA:          1.883 m Patient Age:     20 years      BP:           124/70 mmHg Patient Gender: F             HR:           100 bpm. Exam Location:  Inpatient Procedure: 2D Echo, Cardiac Doppler, Color Doppler and Intracardiac            Opacification Agent Indications:    cva  History:        Patient has no prior history of Echocardiogram examinations.  Sonographer:    Melissa Morford RDCS (AE, PE) Referring Phys: [id number] Lequita Halt  Sonographer Comments: Technically difficult study due to poor echo windows. and Lung interference. IMPRESSIONS  1. Left ventricular ejection fraction, by estimation, is 70 to 75%. The left ventricle has hyperdynamic function. The left ventricle has no regional wall motion abnormalities. There is moderate asymmetric left ventricular hypertrophy of the basal-septal  segment. Left ventricular diastolic parameters are consistent with Grade I diastolic dysfunction (impaired relaxation).  2. Right ventricular systolic function is normal. The right ventricular size is normal. Tricuspid regurgitation signal is inadequate for assessing PA pressure.  3. The mitral valve is normal in structure. No evidence of mitral valve regurgitation.  4. The aortic valve was not well visualized. Aortic valve regurgitation is not visualized. No aortic stenosis is present.  5. The inferior vena cava is normal in size with greater than 50% respiratory variability, suggesting right atrial pressure of 3  mmHg. FINDINGS  Left Ventricle: Left ventricular ejection fraction, by estimation, is 70 to 75%. The left ventricle has hyperdynamic function. The left ventricle has no regional wall motion abnormalities. Definity contrast agent was given IV to delineate the left ventricular endocardial borders. The left ventricular internal cavity size was small. There is moderate asymmetric left ventricular hypertrophy of the basal-septal segment. Left ventricular diastolic parameters are consistent with Grade I diastolic dysfunction (impaired relaxation). Right  Ventricle: The right ventricular size is normal. No increase in right ventricular wall thickness. Right ventricular systolic function is normal. Tricuspid regurgitation signal is inadequate for assessing PA pressure. Left Atrium: Left atrial size was normal in size. Right Atrium: Right atrial size was normal in size. Pericardium: There is no evidence of pericardial effusion. Presence of epicardial fat layer. Mitral Valve: The mitral valve is normal in structure. No evidence of mitral valve regurgitation. Tricuspid Valve: The tricuspid valve is normal in structure. Tricuspid valve regurgitation is not demonstrated. Aortic Valve: The aortic valve was not well visualized. Aortic valve regurgitation is not visualized. No aortic stenosis is present. Pulmonic Valve: The pulmonic valve was not well visualized. Pulmonic valve regurgitation is not visualized. Aorta: The aortic root is normal in size and structure. Venous: The inferior vena cava is normal in size with greater than 50% respiratory variability, suggesting right atrial pressure of 3 mmHg. IAS/Shunts: The interatrial septum was not well visualized.  LEFT VENTRICLE PLAX 2D LVIDd:         3.80 cm   Diastology LVIDs:         2.20 cm   LV e' medial:    5.66 cm/s LV PW:         1.10 cm   LV E/e' medial:  11.6 LV IVS:        1.00 cm   LV e' lateral:   4.90 cm/s LVOT diam:     2.00 cm   LV E/e' lateral: 13.4 LV SV:         62 LV SV Index:   33 LVOT Area:     3.14 cm  RIGHT VENTRICLE RV S prime:     16.30 cm/s TAPSE (M-mode): 1.2 cm LEFT ATRIUM             Index        RIGHT ATRIUM          Index LA diam:        3.50 cm 1.86 cm/m   RA Area:     8.55 cm LA Vol (A2C):   22.7 ml 12.05 ml/m  RA Volume:   14.20 ml 7.54 ml/m LA Vol (A4C):   27.1 ml 14.39 ml/m LA Biplane Vol: 25.8 ml 13.70 ml/m  AORTIC VALVE LVOT Vmax:   113.00 cm/s LVOT Vmean:  78.000 cm/s LVOT VTI:    0.198 m  AORTA Ao Root diam: 3.10 cm MITRAL VALVE MV Area (PHT): 6.17 cm     SHUNTS MV Decel Time:  123 msec     Systemic VTI:  0.20 m MV E velocity: 65.60 cm/s   Systemic Diam: 2.00 cm MV A velocity: 107.00 cm/s MV E/A ratio:  0.61 Andrea Milian MD Electronically signed by Andrea Milian MD Signature Date/Time: 12/13/2020/11:01:37 AM    Final     PHYSICAL EXAM General: Alert, well-developed, well-nourished pleasant elderly lady patient in no acute distress   NEURO:  Mental Status: AA&Ox3  Speech/Language: speech is slow with some difficulty finding words and mild expressive aphasia naming,  and comprehension intact, repetition slow.  No paraphasic errors  Cranial Nerves:  II: PERRL. Visual fields full.  III, IV, VI: EOMI. Eyelids elevate symmetrically.  V: Sensation is intact to light touch and symmetrical to face.  VII: Right facial droop present.  VIII: hearing intact to voice. IX, X: Phonation is normal.  XII: tongue is deviated to the right Motor: 5/5 strength to LUE, LLE, RLE , 4+/5 strength in RUE Tone: is normal and bulk is normal Sensation- Intact to light touch bilaterally. Extinction absent to light touch to DSS. Sharp/Dull   Vibration.   Coordination: FTN intact bilaterally,.No drift. Fine motor movements slower on right with left arm orbiting right Gait- deferred   ASSESSMENT/PLAN Ms. Andrea Rollins is a 71 y.o. female with history of HTN, CKD 3, pre-diabetes and HLD presenting with aphasia and right arm weakness. She was found to have a left MCA stroke in the left precentral gyrus and is undergoing stroke workup.  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 ASPECTS 10.    MRI  small acute infarcts of the left precentral gyrus MRA  10mm supraclinoid right ICA aneurysm, 10mm left paraclinoid ICA aneurysm 2D Echo EF [id number], grade 1 diastolic dysfunction, interarial sseptum not well visualized LDL  105 HgbA1c 6.0 VTE prophylaxis - SCDs    Diet   DIET SOFT Room service appropriate? Yes; Fluid consistency: Thin   No antithrombotic prior to admission, now on aspirin 81 mg daily and clopidogrel 75 mg daily Continue aspirin and clopidogrel for 3 weeks then aspirin alone. Therapy recommendations:  No PT/OT follow up Disposition:  home  Hypertension Home meds:  aldactone 100 mg daily, metoprolol 25 mg daily Stable Permissive hypertension (OK if < 220/120) but gradually normalize in 5-7 days Long-term BP goal normotensive  Hyperlipidemia Home meds:  none LDL 105, goal < 70 Add Atorvastatin 40 mg daily  High intensity statin initiated Continue statin at discharge   Other Stroke Risk Factors Advanced Age >/= 52    Other Active Problems CKD 3 Avoid contrast when possible Renally dose medications as appropriate  Hospital day # [place] , MSN, AGACNP-BC [facility] See Amion for schedule and pager information 12/13/2020 3:35 PM  STROKE MD NOTE :   I have personally obtained history,examined this patient, reviewed notes, independently viewed imaging studies, participated in medical decision making and plan of care.ROS completed by me personally and pertinent positives fully documented  I have made any additions or clarifications directly to the above note. Agree with note above.  She presented with sudden onset of expressive aphasia due to embolic left MCA branch infarct.  Recommend aspirin Plavix for 3 weeks followed by aspirin alone and aggressive risk factor modification.  She will need loop recorder at discharge to look for paroxysmal A. fib.  Speech therapy consult for language.  Long discussion with patient and care team and answered questions.  Greater than 50% time during this 35-minute visit was spent in counseling and coordination of care about her occlusion and embolic stroke and planning work-up and answering questions.  Antony Contras,  MD Medical Director [facility] Pager: [phone] 12/13/2020 4:42 PM   To contact Stroke Continuity provider, please refer to [email or web address] After hours, contact General Neurology

## 2020-12-13 NOTE — ED Notes (Signed)
Back from echo, no changes, alert, NAD, calm, interactive, slurred speech, R facial droop remain, family at [facility].

## 2020-12-13 NOTE — ED Notes (Signed)
Out to car by w/c, by RN, to family x2

## 2020-12-13 NOTE — ED Notes (Signed)
Up to b/r, steady gait, no changes. 

## 2020-12-13 NOTE — ED Notes (Signed)
Family picking pt up

## 2020-12-13 NOTE — ED Notes (Signed)
Pending SLP

## 2020-12-13 NOTE — ED Notes (Signed)
Pt to echo.

## 2020-12-24 ENCOUNTER — Ambulatory Visit: Payer: Medicare PPO | Attending: Internal Medicine

## 2020-12-24 ENCOUNTER — Other Ambulatory Visit: Payer: Self-pay

## 2020-12-24 DIAGNOSIS — R471 Dysarthria and anarthria: Secondary | ICD-10-CM | POA: Diagnosis not present

## 2020-12-24 NOTE — Patient Instructions (Signed)
°  Take the word sheets I provided and say 5-10 /f/ and /s/ words in sentences , x2/day, focusing on that slower more pronounced speech that you have been using.

## 2020-12-24 NOTE — Therapy (Signed)
[facility] [address] [place], [place], [address] Phone: [phone]   Fax:  [phone]  Speech Language Pathology Evaluation  Patient Details  Name: Andrea Rollins MRN: [id number] Date of Birth: February 17, 1950 Referring Provider (SLP): (ref) Dr. Rito Ehrlich, (PCP, doc) Dr. Wilmon Pali   Encounter Date: 12/24/2020   End of Session - 12/24/20 1613     Visit Number 1    Number of Visits 6    Date for SLP Re-Evaluation 02/04/21    SLP Start Time 1320    SLP Stop Time  1400    SLP Time Calculation (min) 40 min              History reviewed. No pertinent surgical history.  There were no vitals filed for this visit.   Subjective Assessment - 12/24/20 1325     Subjective "I speak a little bit slower so that I talk better." States word finding is back to baseline.    Patient is accompained by: --   friend   Currently in Pain? No/denies                SLP Evaluation [facility] - 12/24/20 1325       SLP Visit Information   SLP Received On 12/24/20    Referring Provider (SLP) (ref) Dr. Rito Ehrlich, (PCP, doc) Dr. 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 MRI + small acute infartcs L precentral gyrus. PMH: HTN, prediabetes, CKD3, HLD. 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      Prior Functional Status   Cognitive/Linguistic Baseline Within functional limits      Cognition   Overall Cognitive Status Within Functional Limits for tasks assessed      Auditory Comprehension   Overall Auditory Comprehension Appears within functional limits for tasks assessed      Verbal Expression   Overall Verbal  Expression Appears within functional limits for tasks assessed      Oral Motor/Sensory Function   Overall Oral Motor/Sensory Function Impaired    Labial Symmetry Abnormal symmetry right    Labial Strength Other (Comment)   slight   Lingual Strength Reduced Right   slight   Lingual Coordination WFL   "buy bobby a puppy" x5 rapidly without notable articulatory degredation   Overall Oral Motor/Sensory Function Pt with only very slight detected differences in labial and lingual strength on rt.      Motor Speech   Overall Motor Speech Appears within functional limits for tasks assessed    Articulation --   Pt states her articulation is at about 80% of baseline/WNL   Level of Impairment Conversation   reported with /s/ and with /f/ mostly   Intelligibility Intelligible    Effective Techniques Slow rate   pt states she uses this routinely during the day                            SLP Education - 12/24/20 1612     Education Details her compensations are working (slowed rate of speech, overarticulation)    Person(s)  Educated Patient    Methods Explanation    Comprehension Verbalized understanding              SLP Short Term Goals - 12/24/20 1618       SLP SHORT TERM GOAL #1   Title pt will complete HEP for dysarthria/speech clarity with modified independence in 2 sessions    Time 4    Period Weeks    Status New    Target Date 01/21/21      SLP SHORT TERM GOAL #2   Title pt will rate her speech duirng 15 mintues mod compelx convesation at 90% of baseline in 2 sessions, with modified indpendence    Time 4    Period Weeks    Status New    Target Date 01/21/21              SLP Long Term Goals - 12/24/20 1619       SLP LONG TERM GOAL #1   Title pt will self score Communicative Effectiveness Survey at least 29/32 in teh last 1-2 sessions of ST    Time 6    Period Weeks    Status New    Target Date 02/04/21      SLP LONG TERM GOAL #2   Title pt  will rate her speech at least 95% of baseline in at least two 15-minute mod complex/complex conversations    Time 6    Period Weeks    Status New    Target Date 02/04/21              Plan - 12/24/20 1614     Clinical Impression Statement Andrea Rollins presents today with (reported) mild dysarthria mostly with /s/ in multiple positions, and /f/ in initial position. SLP noted 5 misarticulations in the evaluation today but pt and friend indicate pt is at approx 80% of baseline with speech clarity. She filled out a Geographical information systems officer Survey today and self-scored 26/32 (higher scores indicate less-impact of speech on QOL). Throughout the evaluation (mod complex conversation of up to 8-10 minutes) pt was 100% intelligible, however both Andrea Rollins and friend indicated pt was approx 80% of baseline. SLP noted 10 misarticulations during today's evaluation, mostly with /s/. SLP provided pt with words that contain /s/ and /f/ and told her to choose 5-10 to make sentences out of, targeting clear speech with target sound. SLP believes pt will not require HEP for oral motor strength, but will add one if necessary during the therapy course. Pt will benefit from skilled ST targeting speech precision in conversation.    Speech Therapy Frequency 1x /week    Duration --   6 weeks   Treatment/Interventions Other (comment);Environmental controls;Compensatory techniques;Internal/external aids;SLP instruction and feedback;Patient/family education;Functional tasks   HEP   Potential to Achieve Goals Good    SLP Home Exercise Plan provided today    Consulted and Agree with Plan of Care Patient             Patient will benefit from skilled therapeutic intervention in order to improve the following deficits and impairments:   Dysarthria and anarthria - Plan: SLP plan of care cert/re-cert    Problem List Patient Active Problem List   Diagnosis Date Noted   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 [place], [place], [address] Phone: [phone]   Fax:  [phone]  Name: Andrea Rollins MRN: [id number] Date  of Birth: Jul 17, 1950

## 2021-01-07 ENCOUNTER — Ambulatory Visit: Payer: Medicare PPO

## 2021-01-12 ENCOUNTER — Institutional Professional Consult (permissible substitution): Payer: Medicare PPO | Admitting: Cardiology

## 2021-01-14 ENCOUNTER — Ambulatory Visit: Payer: Medicare PPO

## 2021-01-21 ENCOUNTER — Ambulatory Visit: Payer: Medicare PPO

## 2021-01-28 ENCOUNTER — Ambulatory Visit: Payer: Medicare PPO

## 2021-02-03 ENCOUNTER — Inpatient Hospital Stay: Payer: Medicare PPO | Admitting: Adult Health

## 2021-03-24 ENCOUNTER — Emergency Department (HOSPITAL_COMMUNITY): Payer: Medicare PPO

## 2021-03-24 ENCOUNTER — Encounter (HOSPITAL_COMMUNITY): Payer: Self-pay | Admitting: Emergency Medicine

## 2021-03-24 ENCOUNTER — Emergency Department (HOSPITAL_COMMUNITY)
Admission: EM | Admit: 2021-03-24 | Discharge: 2021-03-24 | Disposition: A | Discharge status: Home / Self Care | Payer: Medicare PPO | Attending: Emergency Medicine | Admitting: Emergency Medicine

## 2021-03-24 ENCOUNTER — Other Ambulatory Visit: Payer: Self-pay

## 2021-03-24 DIAGNOSIS — D72829 Elevated white blood cell count, unspecified: Secondary | ICD-10-CM | POA: Diagnosis not present

## 2021-03-24 DIAGNOSIS — J039 Acute tonsillitis, unspecified: Secondary | ICD-10-CM | POA: Insufficient documentation

## 2021-03-24 DIAGNOSIS — R000 Tachycardia, unspecified: Secondary | ICD-10-CM | POA: Insufficient documentation

## 2021-03-24 DIAGNOSIS — J029 Acute pharyngitis, unspecified: Secondary | ICD-10-CM | POA: Diagnosis present

## 2021-03-24 DIAGNOSIS — R221 Localized swelling, mass and lump, neck: Secondary | ICD-10-CM | POA: Diagnosis not present

## 2021-03-24 DIAGNOSIS — Z79899 Other long term (current) drug therapy: Secondary | ICD-10-CM | POA: Insufficient documentation

## 2021-03-24 DIAGNOSIS — Z7982 Long term (current) use of aspirin: Secondary | ICD-10-CM | POA: Diagnosis not present

## 2021-03-24 LAB — COMPREHENSIVE METABOLIC PANEL
ALT: 13 U/L (ref 0–44)
AST: 19 U/L (ref 15–41)
Albumin: 3.3 g/dL — ABNORMAL LOW (ref 3.5–5.0)
Alkaline Phosphatase: 76 U/L (ref 38–126)
Anion gap: 13 (ref 5–15)
BUN: 8 mg/dL (ref 8–23)
CO2: 23 mmol/L (ref 22–32)
Calcium: 8.9 mg/dL (ref 8.9–10.3)
Chloride: 98 mmol/L (ref 98–111)
Creatinine, Ser: 0.95 mg/dL (ref 0.44–1.00)
GFR, Estimated: >60 mL/min (ref >60)
Glucose, Bld: 150 mg/dL — ABNORMAL HIGH (ref 70–99)
Potassium: 3.3 mmol/L — ABNORMAL LOW (ref 3.5–5.1)
Sodium: 134 mmol/L — ABNORMAL LOW (ref 135–145)
Total Bilirubin: 0.8 mg/dL (ref 0.3–1.2)
Total Protein: 7.6 g/dL (ref 6.5–8.1)

## 2021-03-24 LAB — URINALYSIS, ROUTINE W REFLEX MICROSCOPIC
Bilirubin Urine: NEGATIVE
Glucose, UA: NEGATIVE mg/dL
Hgb urine dipstick: NEGATIVE
Ketones, ur: 5 mg/dL — AB
Nitrite: NEGATIVE
Protein, ur: 30 mg/dL — AB
Specific Gravity, Urine: 1.033 — ABNORMAL HIGH (ref 1.005–1.030)
pH: 5 (ref 5.0–8.0)

## 2021-03-24 LAB — CBC WITH DIFFERENTIAL/PLATELET
Abs Immature Granulocytes: 0.1 10*3/uL — ABNORMAL HIGH (ref 0.00–0.07)
Basophils Absolute: 0 10*3/uL (ref 0.0–0.1)
Basophils Relative: 0 %
Eosinophils Absolute: 0 10*3/uL (ref 0.0–0.5)
Eosinophils Relative: 0 %
HCT: 41.5 % (ref 36.0–46.0)
Hemoglobin: 14.2 g/dL (ref 12.0–15.0)
Immature Granulocytes: 1 %
Lymphocytes Relative: 9 %
Lymphs Abs: 1.5 10*3/uL (ref 0.7–4.0)
MCH: 31.5 pg (ref 26.0–34.0)
MCHC: 34.2 g/dL (ref 30.0–36.0)
MCV: 92 fL (ref 80.0–100.0)
Monocytes Absolute: 1 10*3/uL (ref 0.1–1.0)
Monocytes Relative: 6 %
Neutro Abs: 13.9 10*3/uL — ABNORMAL HIGH (ref 1.7–7.7)
Neutrophils Relative %: 84 %
Platelets: 371 10*3/uL (ref 150–400)
RBC: 4.51 MIL/uL (ref 3.87–5.11)
RDW: 11.3 % — ABNORMAL LOW (ref 11.5–15.5)
WBC: 16.6 10*3/uL — ABNORMAL HIGH (ref 4.0–10.5)
nRBC: 0 % (ref 0.0–0.2)

## 2021-03-24 LAB — LACTIC ACID, PLASMA
Lactic Acid, Venous: 1.7 mmol/L (ref 0.5–1.9)
Lactic Acid, Venous: 1 mmol/L (ref 0.5–1.9)

## 2021-03-24 LAB — PROTIME-INR
INR: 1.2 (ref 0.8–1.2)
Prothrombin Time: 14.8 seconds (ref 11.4–15.2)

## 2021-03-24 LAB — GROUP A STREP BY PCR: Group A Strep by PCR: DETECTED — AB

## 2021-03-24 IMAGING — CT CT NECK W/ CM
3 of 4 series · 13 of 33 positions shown, 16 images · IV contrast (agent unspecified)
Comparison: No prior neck CT.

CLINICAL DATA: Left neck fullness, concern for limb near syndrome

EXAM:
CT NECK WITH CONTRAST
TECHNIQUE: Multidetector CT imaging of the neck was performed using the
standard protocol following the bolus administration of intravenous
contrast.

[Series 3: neck 2.0 i31s 3 · axial · 0.45mm/px · z∈[-192,-58]mm · 5 of 101 slices shown, 7 images]
[im 17/101  soft-tissue]
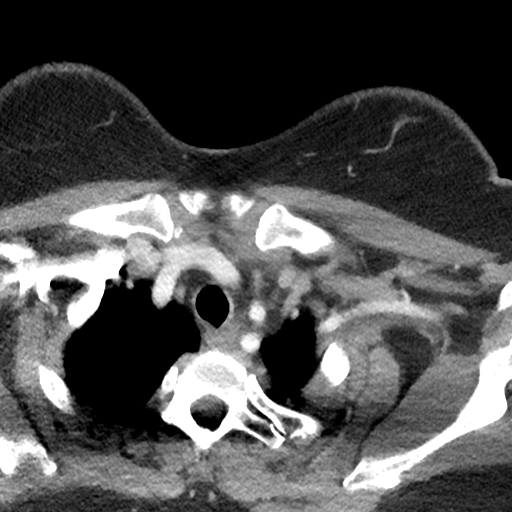
[im 17/101  bone]
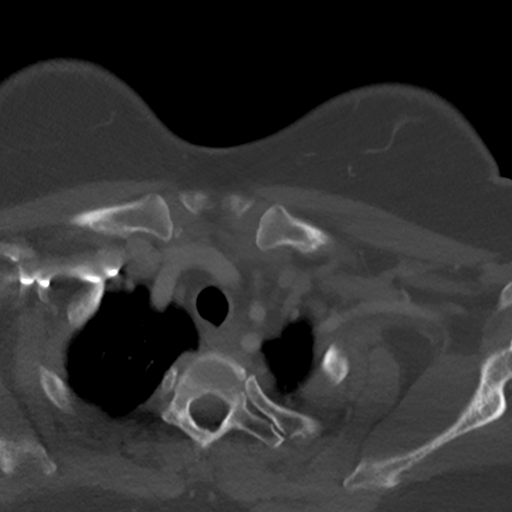
[im 34/101  bone]
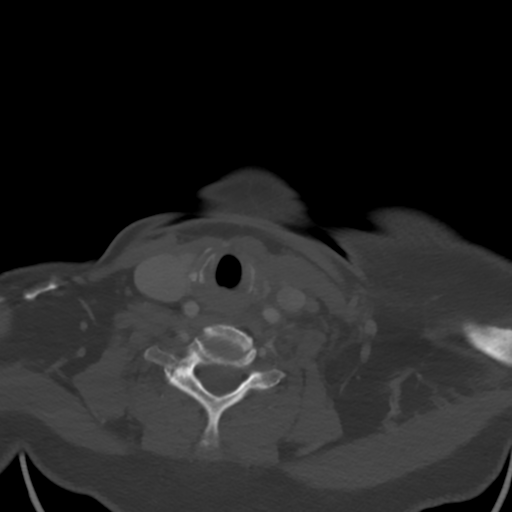
[im 51/101  bone]
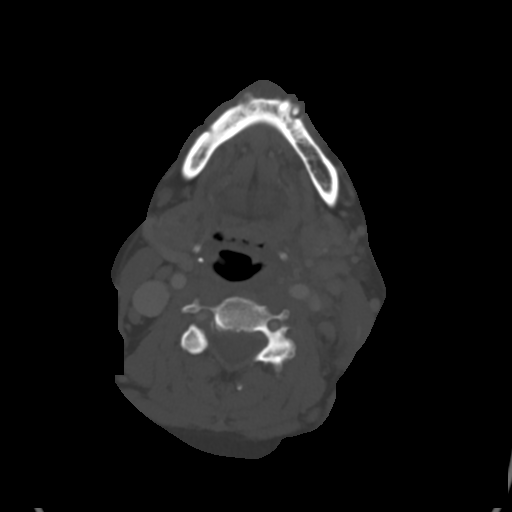
[im 67/101  bone]
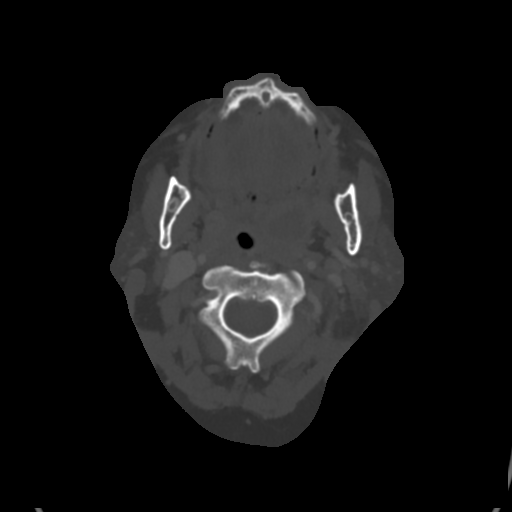
[im 84/101  soft-tissue]
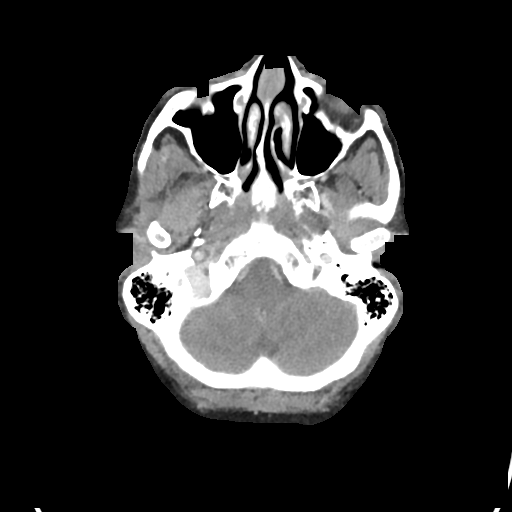
[im 84/101  bone]
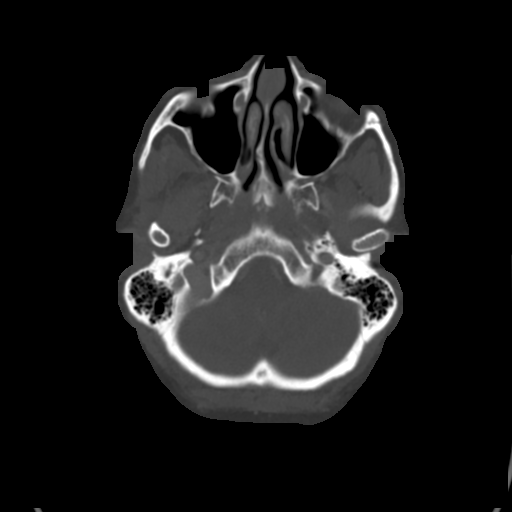

[Series 5: coronal st · coronal · 0.39mm/px · 3 of 124 slices shown]
[im 25/124  bone]
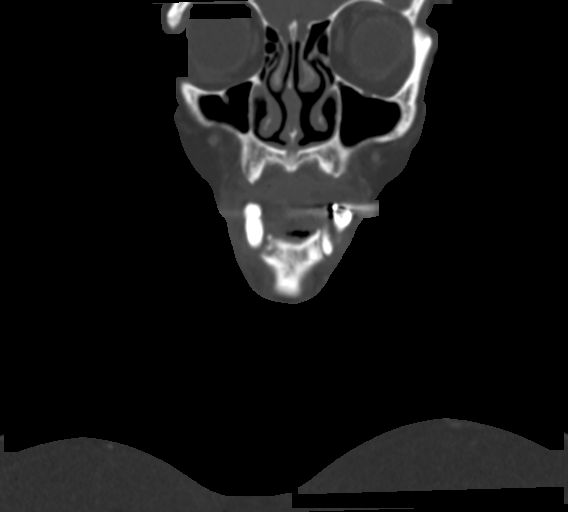
[im 50/124  bone]
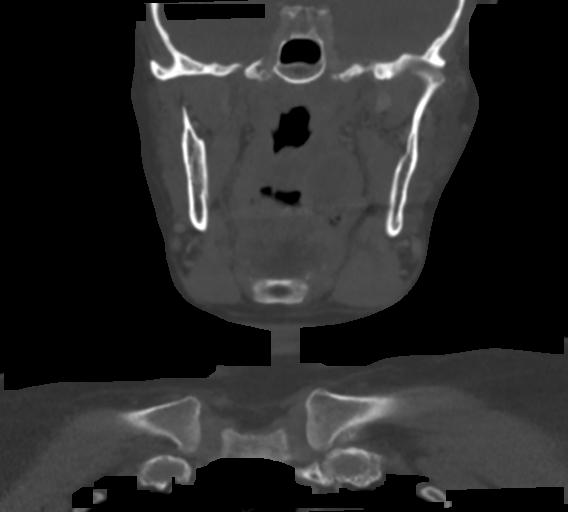
[im 74/124  bone]
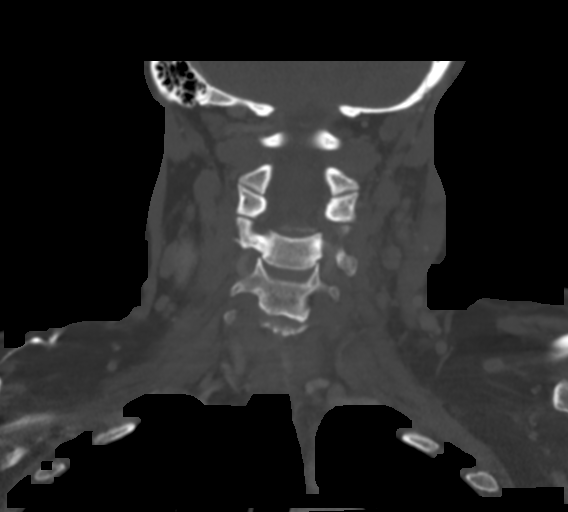

[Series 6: sagittal st · sagittal · 0.40mm/px · 5 of 101 slices shown, 6 images]
[im 34/101  bone]
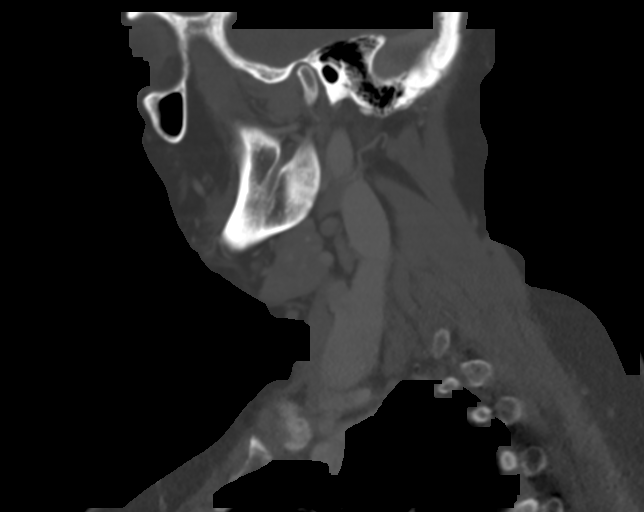
[im 42/101  bone]
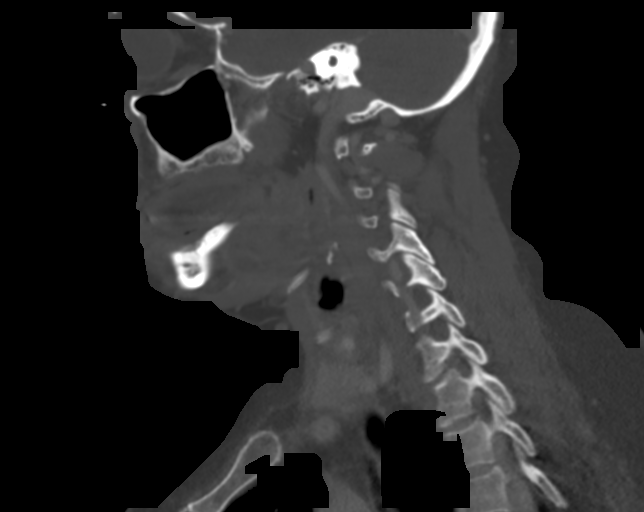
[im 51/101  soft-tissue]
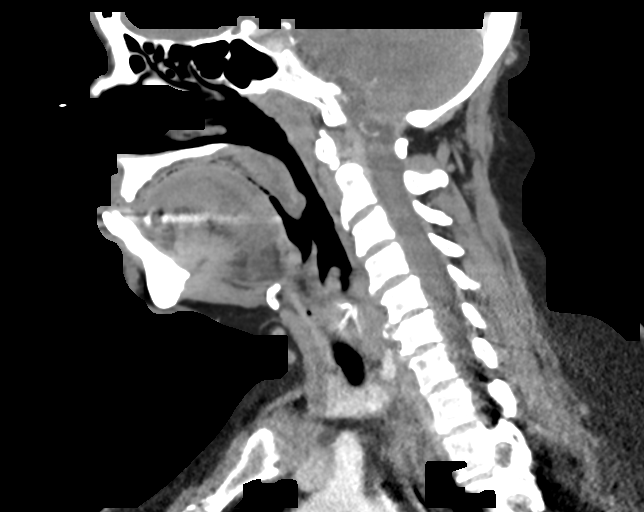
[im 51/101  bone]
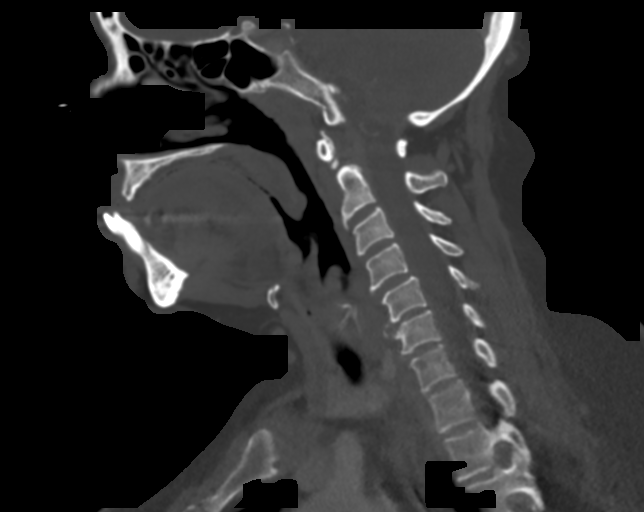
[im 59/101  bone]
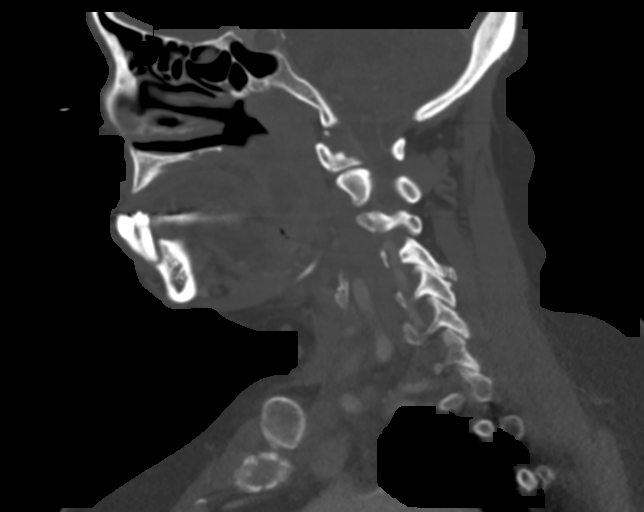
[im 67/101  bone]
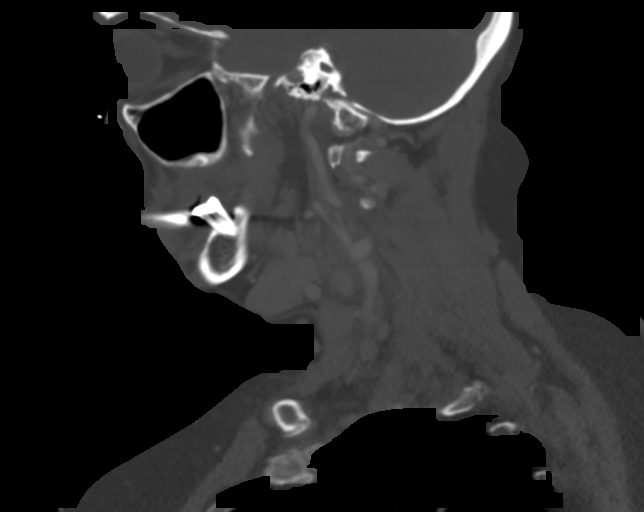

[13 of 33 positions shown; findings below may reference images not displayed]

RADIATION DOSE REDUCTION: This exam was performed according to the
departmental dose-optimization program which includes automated
exposure control, adjustment of the mA and/or kV according to
patient size and/or use of iterative reconstruction technique.

CONTRAST:  75mL OMNIPAQUE IOHEXOL 350 MG/ML SOLN
FINDINGS: Pharynx and larynx: Enlargement of the left palatine tonsil, with a
focal low-density collection, which measures up to 1.3 x 2.0 x
cm (series 3, image 36 and series 5, image 52). Mucosal thickening
and hyperenhancement extends inferiorly along the left oropharynx
(series 5, image 54). The remainder of the pharynx and larynx are
otherwise unremarkable.

Salivary glands: No inflammation, mass, or stone.

Thyroid: Normal.

Lymph nodes: Enlarged left level 2A lymph node, measuring up to 9 mm
in short axis (series 3, image 49), which is normal in density and
favored to be reactive.

Vascular: Patent.

Limited intracranial: Negative.

Visualized orbits: Negative.

Mastoids and visualized paranasal sinuses: Mucous retention cyst in
the left ethmoid air cells. Otherwise clear.

Skeleton: No acute osseous abnormality. Periapical lucency about the
roots of several remaining mandibular teeth.

Upper chest: No focal pulmonary opacity or pleural effusion.

Other: None.
IMPRESSION: 1. Enlargement of the left palatine tonsil with a focal low-density
collection, concerning for tonsillitis with peritonsillar abscess or
phlegmon.
2. Enlarged left level 2A lymph node, favored to be reactive.

## 2021-03-24 MED ORDER — AMOXICILLIN-POT CLAVULANATE 875-125 MG PO TABS: 1.0000 | ORAL_TABLET | Freq: Two times a day (BID) | ORAL | 0 refills | Status: AC

## 2021-03-24 MED ORDER — SODIUM CHLORIDE 0.9 % IV BOLUS
1000.0000 mL | Freq: Once | INTRAVENOUS | Status: AC
  Administered 2021-03-24: 1000 mL via INTRAVENOUS

## 2021-03-24 MED ORDER — IOHEXOL 350 MG/ML SOLN
75.0000 mL | Freq: Once | INTRAVENOUS | Status: AC | PRN
  Administered 2021-03-24: 75 mL via INTRAVENOUS

## 2021-03-24 MED ORDER — ACETAMINOPHEN 325 MG PO TABS
650.0000 mg | ORAL_TABLET | Freq: Once | ORAL | Status: AC | PRN
  Administered 2021-03-24: 650 mg via ORAL
  Filled 2021-03-24: qty 2

## 2021-03-24 MED ORDER — IBUPROFEN 400 MG PO TABS: 400.0000 mg | ORAL_TABLET | Freq: Three times a day (TID) | ORAL | 0 refills | Status: AC

## 2021-03-24 MED ORDER — SODIUM CHLORIDE 0.9 % IV SOLN
3.0000 g | Freq: Once | INTRAVENOUS | Status: AC
  Administered 2021-03-24: 3 g via INTRAVENOUS
  Filled 2021-03-24: qty 8

## 2021-03-24 NOTE — ED Triage Notes (Signed)
Pt came POV after seeing PCP with a complaint of neck swell and sore throat. PCP stated she needed to come to hospital for possible abscess to have it drained. No fevers. Difficulty with swallowing. No SHOB. Rates pain 6/10.  ?

## 2021-03-24 NOTE — ED Provider Notes (Signed)
?[facility] EMERGENCY DEPARTMENT ?Provider Note ? ? ?CSN: [id number] ?Arrival date & time: 03/24/21  1737 ? ?  ? ?History ? ?Chief Complaint  ?Patient presents with  ? Abscess  ? ? ?Andrea Rollins is a 71 y.o. female. ? ?HPI ?Patient presents with her sister assists with the history. ?She complains of left-sided neck lesion.  This began about a day ago, prior to that she has had sore throat.  She subsequently developed fever, no vomiting, no nausea. ?She saw her physician, negative COVID, negative flu test, sent here for evaluation.  She denies difficulty with swallowing, speaking, breathing, but has palpable fullness in the left side, painful to the touch. ?  ? ?Home Medications ?Prior to Admission medications   ?Medication Sig Start Date End Date Taking? Authorizing Provider  ?aspirin EC 81 MG EC tablet Take 1 tablet (81 mg total) by mouth daily. Swallow whole. 12/14/20   Osvaldo Shipper, MD  ?atorvastatin (LIPITOR) 40 MG tablet Take 1 tablet (40 mg total) by mouth daily. 12/14/20   Osvaldo Shipper, MD  ?cholecalciferol (VITAMIN D3) 25 MCG (1000 UNIT) tablet Take 4,000 Units by mouth daily.    [provider]  ?metoprolol tartrate (LOPRESSOR) 25 MG tablet Take 25 mg by mouth 2 (two) times daily.    [provider]  ?spironolactone (ALDACTONE) 100 MG tablet Take 100 mg by mouth daily. ?Patient not taking: Reported on 12/24/2020    [provider]  ?   ? ?Allergies    ?Nickel   ? ?Review of Systems   ?Review of Systems  ?Constitutional:   ?     Per HPI, otherwise negative  ?HENT:    ?     Per HPI, otherwise negative  ?Respiratory:    ?     Per HPI, otherwise negative  ?Cardiovascular:   ?     Per HPI, otherwise negative  ?Gastrointestinal:  Negative for vomiting.  ?Endocrine:  ?     Negative aside from HPI  ?Genitourinary:   ?     Neg aside from HPI   ?Musculoskeletal:   ?     Per HPI, otherwise negative  ?Skin: Negative.   ?Neurological:  Negative for syncope.  ? ?Physical  Exam ?Updated Vital Signs ?BP 129/75   Pulse 100   Temp (!) 100.7 ?F (38.2 ?C) (Oral)   Resp 18   Ht 5\' 3"  (1.6 m)   Wt 84.8 kg   SpO2 98%   BMI 33.13 kg/m?  ?Physical Exam ?Vitals and nursing note reviewed.  ?Constitutional:   ?   General: She is not in acute distress. ?   Appearance: She is well-developed.  ?HENT:  ?   Head: Normocephalic and atraumatic.  ?   Mouth/Throat:  ? ?Eyes:  ?   Conjunctiva/sclera: Conjunctivae normal.  ?Neck:  ? ?Cardiovascular:  ?   Rate and Rhythm: Normal rate and regular rhythm.  ?Pulmonary:  ?   Effort: Pulmonary effort is normal. No respiratory distress.  ?   Breath sounds: Normal breath sounds. No stridor.  ?Abdominal:  ?   General: There is no distension.  ?Skin: ?   General: Skin is warm and dry.  ?Neurological:  ?   Mental Status: She is alert and oriented to person, place, and time.  ?   Cranial Nerves: No cranial nerve deficit.  ?Psychiatric:     ?   Mood and Affect: Mood normal.  ? ? ?ED Results / Procedures / Treatments   ?Labs ?(  all labs ordered are listed, but only abnormal results are displayed) ?Labs Reviewed  ?GROUP A STREP BY PCR - Abnormal; Notable for the following components:  ?    Result Value  ? Group A Strep by PCR DETECTED (*)   ? All other components within normal limits  ?COMPREHENSIVE METABOLIC PANEL - Abnormal; Notable for the following components:  ? Sodium 134 (*)   ? Potassium 3.3 (*)   ? Glucose, Bld 150 (*)   ? Albumin 3.3 (*)   ? All other components within normal limits  ?CBC WITH DIFFERENTIAL/PLATELET - Abnormal; Notable for the following components:  ? WBC 16.6 (*)   ? RDW 11.3 (*)   ? Neutro Abs 13.9 (*)   ? Abs Immature Granulocytes 0.10 (*)   ? All other components within normal limits  ?URINALYSIS, ROUTINE W REFLEX MICROSCOPIC - Abnormal; Notable for the following components:  ? Color, Urine AMBER (*)   ? APPearance CLOUDY (*)   ? Specific Gravity, Urine 1.033 (*)   ? Ketones, ur 5 (*)   ? Protein, ur 30 (*)   ? Leukocytes,Ua TRACE (*)    ? Bacteria, UA MANY (*)   ? All other components within normal limits  ?CULTURE, BLOOD (ROUTINE X 2)  ?CULTURE, BLOOD (ROUTINE X 2)  ?LACTIC ACID, PLASMA  ?LACTIC ACID, PLASMA  ?PROTIME-INR  ? ? ? ? ?Radiology ?Results available in PACS ? ?Procedures ?Procedures  ? ? ?Medications Ordered in ED ?Medications  ?Ampicillin-Sulbactam (UNASYN) 3 g in sodium chloride 0.9 % 100 mL IVPB (has no administration in time range)  ?acetaminophen (TYLENOL) tablet 650 mg (650 mg Oral Given 03/24/21 1830)  ?sodium chloride 0.9 % bolus 1,000 mL (0 mLs Intravenous Stopped 03/24/21 2056)  ?iohexol (OMNIPAQUE) 350 MG/ML injection 75 mL (75 mLs Intravenous Contrast Given 03/24/21 2125)  ? ? ?ED Course/ Medical Decision Making/ A&P ? This patient presents to the ED for concern of sore throat, fever, neck mass.  On arrival she is tachycardic, febrile, meets SIRS criteria, this involves an extensive number of treatment options, and is a complaint that carries with it a high risk of complications and morbidity.  The differential diagnosis includes strep, bacteremia, LaMere syndrome ? ? ?Co morbidities that complicate the patient evaluation ? ?Age, poor dentition ? ?Social Determinants of Health: ? ?Age ? ?Additional history obtained: ? ?Additional history and/or information obtained from sister ?External records from outside source obtained and reviewed including chart review with ongoing evaluation for previously diagnosed cerebral aneurysm.  Patient is scheduled for coiling in 1 month ? ? ?After the initial evaluation, orders, including: Labs, CT were initiated. ? ? ?Patient placed on Cardiac and Pulse-Oximetry Monitors. ?The patient was maintained on a cardiac monitor.  The cardiac monitored showed an rhythm of 105 sinus tach abnormal ?The patient was also maintained on pulse oximetry. The readings were typically 100% room air normal ? ? ?On repeat evaluation of the patient improved ? ?Lab Tests: ? ?I personally interpreted labs.  The  pertinent results include: Leukocytosis ? ?Imaging Studies ordered: ? ?I independently visualized and interpreted imaging which showed tonsillitis, no abscess, no evidence for Lemierre's syndrome ?I agree with the radiologist interpretation ? ?11:15 PM ?On repeat exam the patient awakens easily is in no distress, speaking clearly, is afebrile, hemodynamically unremarkable.  We will length conversation about all findings, including evidence for tonsillitis, no drainable abscess, no Lemierre's syndrome, no bacteremia, no sepsis.  She, her sister and I discussed return precautions, follow-up  instructions, she will transition from Unasyn which I discussed with our pharmacist to Augmentin, be discharged in stable condition.  Hospitalization considered, not indicated given the absence of abscess, improvement with antibiotics, fluids, Tylenol. ? ? ? ?Final Clinical Impression(s) / ED Diagnoses ?Final diagnoses:  ?Tonsillitis  ? ? ?Rx / DC Orders ?ED Discharge Orders   ? ?      Ordered  ?  amoxicillin-clavulanate (AUGMENTIN) 875-125 MG tablet  Every 12 hours       ? 03/24/21 2314  ?  ibuprofen (ADVIL) 400 MG tablet  3 times daily       ? 03/24/21 2314  ? ?  ?  ? ?  ? ? ?  ?Gerhard Munch, MD ?03/24/21 2316 ? ?

## 2021-03-24 NOTE — Discharge Instructions (Addendum)
As discussed, your evaluation today has been largely reassuring.  But, it is important that you monitor your condition carefully, and do not hesitate to return to the ED if you develop new, or concerning changes in your condition. ? ?Otherwise, please follow-up with your physician for appropriate ongoing care. ? ?

## 2021-03-24 NOTE — ED Provider Triage Note (Signed)
Emergency Medicine Provider Triage Evaluation Note ? ?Andrea Rollins , a 71 y.o. female  was evaluated in triage.  Pt complains of left-sided neck mass of 1 day duration.  Patient reports over the past 4 days she has had a sore throat and then since yesterday she has developed swelling on the left side of her neck.  She endorses fever.  She was evaluated by PCP and had negative COVID and flu and was instructed to come into the emergency room for evaluation.  She has been able to tolerate p.o. intake without difficulty.  Denies shortness of breath. ? ?Review of Systems  ?Positive: As above ?Negative: Of as above ? ?Physical Exam  ?BP (!) 157/87 (BP Location: Right Arm)   Pulse (!) 127   Temp (!) 100.7 ?F (38.2 ?C) (Oral)   Resp 17   Ht 5\' 3"  (1.6 m)   Wt 84.8 kg   SpO2 97%   BMI 33.13 kg/m?  ?Gen:   Awake, no distress   ?Resp:  Normal effort  ?MSK:   Moves extremities without difficulty ?Other:  Airway patent.  Erythematous pharynx.  Left-sided neck mass. ? ?Medical Decision Making  ?Medically screening exam initiated at 6:35 PM.  Appropriate orders placed.  Andrea Rollins was informed that the remainder of the evaluation will be completed by another provider, this initial triage assessment does not replace that evaluation, and the importance of remaining in the ED until their evaluation is complete. ? ? ?  ?Andrea Cunas, PA-C ?03/24/21 1836 ? ?

## 2021-03-24 NOTE — ED Notes (Signed)
Pt just arrived into room from CT scan, Md at bedside ?

## 2021-03-29 LAB — CULTURE, BLOOD (ROUTINE X 2)
Culture: NO GROWTH
Culture: NO GROWTH
Special Requests: ADEQUATE
Special Requests: ADEQUATE

## 2021-04-26 ENCOUNTER — Institutional Professional Consult (permissible substitution): Payer: Medicare PPO | Admitting: Cardiology

## 2023-09-14 ENCOUNTER — Telehealth: Payer: Self-pay | Admitting: Neurosurgery

## 2023-09-14 NOTE — Telephone Encounter (Signed)
 Established patient of Dr. Janjua - needs follow up in November with MRA (patient would like open MRA if possible) prior at [facility]   Please place order - Appt with Dr. Janjua scheduled for 11/20/23

## 2023-09-19 NOTE — Progress Notes (Signed)
 Chart reviewed complete for [employer] forms project.  Form submitted for AWV DOS 09/06/2023.

## 2023-10-01 ENCOUNTER — Other Ambulatory Visit: Payer: Self-pay

## 2023-10-01 DIAGNOSIS — I671 Cerebral aneurysm, nonruptured: Secondary | ICD-10-CM

## 2023-10-01 NOTE — Telephone Encounter (Signed)
 Order placed to be done at [facility] [address].

## 2023-10-03 ENCOUNTER — Telehealth: Payer: Self-pay | Admitting: Neurosurgery

## 2023-10-03 NOTE — Telephone Encounter (Signed)
 Clarified this with [facility]. They will call the patient to schedule.

## 2023-10-03 NOTE — Telephone Encounter (Signed)
 Patient states she has an appointment in November and she needs an MRI beforehand.   [facility] called the patient to discuss previous device that was inserted from Dr.Janjua. patient cannot remember device name- [facility] needs this needs information before scheduling. Please advise.   *can message [facility] with this information*

## 2023-10-03 NOTE — Telephone Encounter (Signed)
 Per review of Patient chart. Patient had a coiling of Aneurysm with Dr. Rosslyn. No mention in care everywhere regarding VP Shunt or implanted device.   Calling [facility] to confirm this information.

## 2023-11-20 ENCOUNTER — Ambulatory Visit: Admitting: Neurosurgery
